# Patient Record
Sex: Male | Born: 1985 | Hispanic: Yes | Marital: Married | Smoking: Former smoker
Health system: Southern US, Community
[De-identification: ages and names within clinical notes are randomized; demographics above are authoritative.]

## PROBLEM LIST (undated history)

## (undated) DIAGNOSIS — Z860100 Personal history of colon polyps, unspecified: Secondary | ICD-10-CM

## (undated) DIAGNOSIS — E663 Overweight: Secondary | ICD-10-CM

## (undated) HISTORY — DX: Personal history of colon polyps, unspecified: Z86.0100

## (undated) HISTORY — DX: Personal history of colonic polyps: Z86.010

## (undated) HISTORY — DX: Overweight: E66.3

## (undated) HISTORY — PX: NO PAST SURGERIES: SHX2092

---

## 2009-08-24 LAB — HM COLONOSCOPY

## 2018-02-22 ENCOUNTER — Encounter: Payer: Self-pay | Admitting: Family Medicine

## 2018-02-22 ENCOUNTER — Ambulatory Visit (INDEPENDENT_AMBULATORY_CARE_PROVIDER_SITE_OTHER): Payer: BLUE CROSS/BLUE SHIELD | Admitting: Family Medicine

## 2018-02-22 VITALS — BP 100/62 | HR 89 | Temp 98.8°F | Resp 16 | Ht 70.0 in | Wt 157.0 lb

## 2018-02-22 DIAGNOSIS — Z0000 Encounter for general adult medical examination without abnormal findings: Secondary | ICD-10-CM

## 2018-02-22 NOTE — Patient Instructions (Signed)
Preventive Care 18-39 Years, Male Preventive care refers to lifestyle choices and visits with your health care provider that can promote health and wellness. What does preventive care include?  A yearly physical exam. This is also called an annual well check.  Dental exams once or twice a year.  Routine eye exams. Ask your health care provider how often you should have your eyes checked.  Personal lifestyle choices, including: ? Daily care of your teeth and gums. ? Regular physical activity. ? Eating a healthy diet. ? Avoiding tobacco and drug use. ? Limiting alcohol use. ? Practicing safe sex. What happens during an annual well check? The services and screenings done by your health care provider during your annual well check will depend on your age, overall health, lifestyle risk factors, and family history of disease. Counseling Your health care provider may ask you questions about your:  Alcohol use.  Tobacco use.  Drug use.  Emotional well-being.  Home and relationship well-being.  Sexual activity.  Eating habits.  Work and work Statistician.  Screening You may have the following tests or measurements:  Height, weight, and BMI.  Blood pressure.  Lipid and cholesterol levels. These may be checked every 5 years starting at age 34.  Diabetes screening. This is done by checking your blood sugar (glucose) after you have not eaten for a while (fasting).  Skin check.  Hepatitis C blood test.  Hepatitis B blood test.  Sexually transmitted disease (STD) testing.  Discuss your test results, treatment options, and if necessary, the need for more tests with your health care provider. Vaccines Your health care provider may recommend certain vaccines, such as:  Influenza vaccine. This is recommended every year.  Tetanus, diphtheria, and acellular pertussis (Tdap, Td) vaccine. You may need a Td booster every 10 years.  Varicella vaccine. You may need this if you  have not been vaccinated.  HPV vaccine. If you are 23 or younger, you may need three doses over 6 months.  Measles, mumps, and rubella (MMR) vaccine. You may need at least one dose of MMR.You may also need a second dose.  Pneumococcal 13-valent conjugate (PCV13) vaccine. You may need this if you have certain conditions and have not been vaccinated.  Pneumococcal polysaccharide (PPSV23) vaccine. You may need one or two doses if you smoke cigarettes or if you have certain conditions.  Meningococcal vaccine. One dose is recommended if you are age 65-21 years and a first-year college student living in a residence hall, or if you have one of several medical conditions. You may also need additional booster doses.  Hepatitis A vaccine. You may need this if you have certain conditions or if you travel or work in places where you may be exposed to hepatitis A.  Hepatitis B vaccine. You may need this if you have certain conditions or if you travel or work in places where you may be exposed to hepatitis B.  Haemophilus influenzae type b (Hib) vaccine. You may need this if you have certain risk factors.  Talk to your health care provider about which screenings and vaccines you need and how often you need them. This information is not intended to replace advice given to you by your health care provider. Make sure you discuss any questions you have with your health care provider. Document Released: 02/06/2002 Document Revised: 08/30/2016 Document Reviewed: 10/12/2015 Elsevier Interactive Patient Education  [employer].

## 2018-02-22 NOTE — Progress Notes (Signed)
Patient: Sean Butler, Male    DOB: 1985-12-26, 32 y.o.   MRN: [id number] Visit Date: 02/22/2018  Today's Provider: Shirlee Latch, MD   I, Joslyn Hy, CMA, am acting as scribe for Shirlee Latch, MD.  Chief Complaint  Patient presents with  . Establish Care   Subjective:    Establish Care Sean Butler is a 32 y.o. male who presents today for health maintenance and to establish care. He feels fairly well. Is c/o cough and some sore throat. He reports exercising 4-5 days per week for 45 minutes; pull-ups, pushups, squats, etc.. He reports he is sleeping well.  -----------------------------------------------------------------   Review of Systems  Constitutional: Negative.   HENT: Positive for sore throat. Negative for congestion, dental problem, drooling, ear discharge, ear pain, facial swelling, hearing loss, mouth sores, nosebleeds, postnasal drip, rhinorrhea, sinus pressure, sinus pain, sneezing, tinnitus, trouble swallowing and voice change.   Eyes: Negative.   Respiratory: Positive for cough. Negative for apnea, choking, chest tightness, shortness of breath, wheezing and stridor.   Cardiovascular: Negative.   Gastrointestinal: Negative.   Endocrine: Negative.   Genitourinary: Negative.   Musculoskeletal: Negative.   Skin: Negative.   Allergic/Immunologic: Negative.   Neurological: Negative.   Hematological: Negative.   Psychiatric/Behavioral: Negative.     Social History      He  reports that  has never smoked. he has never used smokeless tobacco. He reports that he drinks alcohol. He reports that he does not use drugs.       Social History   Socioeconomic History  . Marital status: Married    Spouse name: Sean Butler  . Number of children: 2  . Years of education: None  . Highest education level: Some college, no degree  Social Needs  . Financial resource strain: Not hard at all  . Food insecurity - worry: Never true  . Food insecurity -  inability: Never true  . Transportation needs - medical: No  . Transportation needs - non-medical: No  Occupational History  . Occupation: Realtor  Tobacco Use  . Smoking status: Never Smoker  . Smokeless tobacco: Never Used  Substance and Sexual Activity  . Alcohol use: Yes    Comment: 1 beer every 6 months  . Drug use: No  . Sexual activity: Yes    Partners: Female    Birth control/protection: None  Other Topics Concern  . None  Social History Narrative  . None    History reviewed. No pertinent past medical history.   There are no active problems to display for this patient.   Past Surgical History:  Procedure Laterality Date  . NO PAST SURGERIES      Family History        Family Status  Relation Name Status  . Mother  Alive  . Father  Deceased  . Sister  Alive  . Brother  Alive  . MGM  (Not Specified)  . MGF  Alive  . Sister  Alive  . Neg Hx  (Not Specified)        His family history includes Cancer in his father; Diabetes in his maternal grandfather; Healthy in his brother, mother, sister, and sister; Liver cancer in his maternal grandmother. There is no history of Colon cancer or Prostate cancer.      No Known Allergies  No current outpatient medications on file.   Patient Care Team: Erasmo Downer, MD as PCP - General (Family Medicine)  Objective:   Vitals: BP 100/62 (BP Location: Left Arm, Patient Position: Sitting, Cuff Size: Normal)   Pulse 89   Temp 98.8 F (37.1 C) (Oral)   Resp 16   Ht 5\' 10"  (1.778 m)   Wt 157 lb (71.2 kg)   SpO2 99%   BMI 22.53 kg/m    Vitals:   02/22/18 1423  BP: 100/62  Pulse: 89  Resp: 16  Temp: 98.8 F (37.1 C)  TempSrc: Oral  SpO2: 99%  Weight: 157 lb (71.2 kg)  Height: 5\' 10"  (1.778 m)     Physical Exam  Constitutional: He is oriented to person, place, and time. He appears well-developed and well-nourished. No distress.  HENT:  Head: Normocephalic and atraumatic.  Right Ear: External  ear normal.  Left Ear: External ear normal.  Nose: Nose normal.  Mouth/Throat: Oropharynx is clear and moist.  Eyes: Conjunctivae and EOM are normal. Pupils are equal, round, and reactive to light. No scleral icterus.  Neck: Neck supple. No thyromegaly present.  Cardiovascular: Normal rate, regular rhythm, normal heart sounds and intact distal pulses.  No murmur heard. Pulmonary/Chest: Effort normal and breath sounds normal. No respiratory distress. He has no wheezes. He has no rales.  Abdominal: Soft. Bowel sounds are normal. He exhibits no distension. There is no tenderness. There is no rebound and no guarding.  Musculoskeletal: He exhibits no edema or deformity.  Lymphadenopathy:    He has no cervical adenopathy.  Neurological: He is alert and oriented to person, place, and time.  Skin: Skin is warm and dry. No rash noted.  Psychiatric: He has a normal mood and affect. His behavior is normal.  Vitals reviewed.    Depression Screen PHQ 2/9 Scores 02/22/2018  PHQ - 2 Score 1     Assessment & Plan:     Routine Health Maintenance and Physical Exam  Exercise Activities and Dietary recommendations Goals    None       There is no immunization history on file for this patient.  Health Maintenance  Topic Date Due  . HIV Screening  01/17/2002  . TETANUS/TDAP  01/17/2006  . INFLUENZA VACCINE  03/24/2018 (Originally 07/25/2017)   Will request TDAP records from [facility] (patient reports having TDAP 3 years ago when daughter was born)  Discussed health benefits of physical activity, and encouraged him to engage in regular exercise appropriate for his age and condition.    -------------------------------------------------------------------- Orders Placed This Encounter  Procedures  . Basic Metabolic Panel (BMET)    Order Specific Question:   Has the patient fasted?    Answer:   No  . Lipid panel    Order Specific Question:   Has the patient fasted?    Answer:   No    Return in  about 1 year (around 02/23/2019) for physical.   The entirety of the information documented in the History of Present Illness, Review of Systems and Physical Exam were personally obtained by me. Portions of this information were initially documented by Irving BurtonEmily Ratchford, CMA and reviewed by me for thoroughness and accuracy.    Erasmo DownerBacigalupo, Zephyr Sausedo M, MD, MPH [facility] 02/22/2018 3:38 PM

## 2018-02-25 DIAGNOSIS — Z0000 Encounter for general adult medical examination without abnormal findings: Secondary | ICD-10-CM | POA: Diagnosis not present

## 2018-02-26 LAB — LIPID PANEL
CHOLESTEROL TOTAL: 135 mg/dL (ref 100–199)
Chol/HDL Ratio: 2.1 ratio (ref 0.0–5.0)
HDL: 65 mg/dL (ref >39)
LDL CALC: 62 mg/dL (ref 0–99)
TRIGLYCERIDES: 39 mg/dL (ref 0–149)
VLDL Cholesterol Cal: 8 mg/dL (ref 5–40)

## 2018-02-26 LAB — BASIC METABOLIC PANEL
BUN / CREAT RATIO: 19 (ref 9–20)
BUN: 14 mg/dL (ref 6–20)
CHLORIDE: 102 mmol/L (ref 96–106)
CO2: 24 mmol/L (ref 20–29)
CREATININE: 0.74 mg/dL — AB (ref 0.76–1.27)
Calcium: 9.1 mg/dL (ref 8.7–10.2)
GFR calc Af Amer: 142 mL/min/{1.73_m2} (ref >59)
GFR calc non Af Amer: 123 mL/min/{1.73_m2} (ref >59)
GLUCOSE: 79 mg/dL (ref 65–99)
POTASSIUM: 3.9 mmol/L (ref 3.5–5.2)
SODIUM: 142 mmol/L (ref 134–144)

## 2018-03-01 ENCOUNTER — Telehealth: Payer: Self-pay

## 2018-03-01 NOTE — Telephone Encounter (Signed)
-----   Message from Erasmo DownerAngela M Bacigalupo, MD sent at 02/28/2018  8:43 AM EST ----- Normal kidney function, blood sugar, electrolytes.  Great cholesterol  Bacigalupo, Marzella SchleinAngela M, MD, MPH [facility] 02/28/2018 8:43 AM

## 2018-03-01 NOTE — Telephone Encounter (Signed)
Pt advised.

## 2018-09-25 ENCOUNTER — Telehealth: Payer: Self-pay | Admitting: Family Medicine

## 2018-09-25 NOTE — Telephone Encounter (Signed)
Blood type is not in chart. Patient advised

## 2018-09-25 NOTE — Telephone Encounter (Signed)
Pt is wanting to know if his blood type is in has chart anywhere.  He is completing a form that ask this information.  He knows Dr. Leonard Schwartz is out until next week but wants to know if someone can check for him  CB#  [phone]  Thanks Barth Kirks

## 2019-02-25 ENCOUNTER — Encounter: Payer: Self-pay | Admitting: Family Medicine

## 2019-02-25 ENCOUNTER — Ambulatory Visit (INDEPENDENT_AMBULATORY_CARE_PROVIDER_SITE_OTHER): Payer: BLUE CROSS/BLUE SHIELD | Admitting: Family Medicine

## 2019-02-25 VITALS — BP 109/61 | HR 76 | Temp 97.4°F | Ht 70.0 in | Wt 182.8 lb

## 2019-02-25 DIAGNOSIS — E663 Overweight: Secondary | ICD-10-CM | POA: Diagnosis not present

## 2019-02-25 DIAGNOSIS — Z0000 Encounter for general adult medical examination without abnormal findings: Secondary | ICD-10-CM

## 2019-02-25 DIAGNOSIS — Z114 Encounter for screening for human immunodeficiency virus [HIV]: Secondary | ICD-10-CM | POA: Diagnosis not present

## 2019-02-25 NOTE — Progress Notes (Signed)
Patient: Sean Butler, Male    DOB: 03-18-1986, 33 y.o.   MRN: [id number] Visit Date: 02/25/2019  Today's Provider: Shirlee Latch, MD   Chief Complaint  Patient presents with  . Annual Exam   Subjective:    I, Presley Raddle, CMA, am acting as a scribe for Shirlee Latch, MD.    Annual physical exam Sean Butler is a 33 y.o. male who presents today for health maintenance and complete physical. He feels well. He reports exercising 3 days per week. He reports he is sleeping fairly well.  -----------------------------------------------------------------   Review of Systems  Constitutional: Negative.   HENT: Negative.   Eyes: Positive for photophobia.  Respiratory: Negative.   Cardiovascular: Negative.   Gastrointestinal: Negative.   Endocrine: Negative.   Genitourinary: Negative.   Musculoskeletal: Negative.   Skin: Negative.   Allergic/Immunologic: Negative.   Neurological: Negative.   Hematological: Negative.   Psychiatric/Behavioral: Negative.     Social History      He  reports that he has never smoked. He has never used smokeless tobacco. He reports current alcohol use. He reports that he does not use drugs.       Social History   Socioeconomic History  . Marital status: Married    Spouse name: Pollie Meyer  . Number of children: 2  . Years of education: Not on file  . Highest education level: Some college, no degree  Occupational History  . Occupation: Veterinary surgeon  Social Needs  . Financial resource strain: Not hard at all  . Food insecurity:    Worry: Never true    Inability: Never true  . Transportation needs:    Medical: No    Non-medical: No  Tobacco Use  . Smoking status: Never Smoker  . Smokeless tobacco: Never Used  Substance and Sexual Activity  . Alcohol use: Yes    Comment: 1 beer every 6 months  . Drug use: No  . Sexual activity: Yes    Partners: Female    Birth control/protection: None  Lifestyle  . Physical activity:    Days  per week: 4 days    Minutes per session: 50 min  . Stress: Not on file  Relationships  . Social connections:    Talks on phone: Not on file    Gets together: Not on file    Attends religious service: Not on file    Active member of club or organization: Not on file    Attends meetings of clubs or organizations: Not on file    Relationship status: Not on file  Other Topics Concern  . Not on file  Social History Narrative  . Not on file    History reviewed. No pertinent past medical history.   There are no active problems to display for this patient.   Past Surgical History:  Procedure Laterality Date  . NO PAST SURGERIES      Family History        Family Status  Relation Name Status  . Mother  Alive  . Father  Deceased  . Sister  Alive  . Brother  Alive  . MGM  (Not Specified)  . MGF  Alive  . Sister  Alive  . Neg Hx  (Not Specified)        His family history includes Cancer in his father; Diabetes in his maternal grandfather; Healthy in his brother, mother, sister, and sister; Liver cancer in his maternal grandmother. There is no history of Colon  cancer or Prostate cancer.      No Known Allergies  No current outpatient medications on file.   Patient Care Team: Erasmo Downer, MD as PCP - General (Family Medicine)    Objective:    Vitals: BP 109/61 (BP Location: Right Arm, Patient Position: Sitting, Cuff Size: Normal)   Pulse 76   Temp (!) 97.4 F (36.3 C) (Oral)   Ht 5\' 10"  (1.778 m)   Wt 182 lb 12.8 oz (82.9 kg)   SpO2 99%   BMI 26.23 kg/m    Vitals:   02/25/19 0949  BP: 109/61  Pulse: 76  Temp: (!) 97.4 F (36.3 C)  TempSrc: Oral  SpO2: 99%  Weight: 182 lb 12.8 oz (82.9 kg)  Height: 5\' 10"  (1.778 m)     Physical Exam Vitals signs reviewed.  Constitutional:      General: He is not in acute distress.    Appearance: Normal appearance. He is well-developed. He is not diaphoretic.  HENT:     Head: Normocephalic and atraumatic.      Right Ear: Tympanic membrane, ear canal and external ear normal.     Left Ear: Tympanic membrane, ear canal and external ear normal.     Nose: Nose normal. No congestion.     Mouth/Throat:     Mouth: Mucous membranes are moist.     Pharynx: Oropharynx is clear. No oropharyngeal exudate.  Eyes:     General: No scleral icterus.    Conjunctiva/sclera: Conjunctivae normal.     Pupils: Pupils are equal, round, and reactive to light.  Neck:     Musculoskeletal: Neck supple.     Thyroid: No thyromegaly.  Cardiovascular:     Rate and Rhythm: Normal rate and regular rhythm.     Pulses: Normal pulses.     Heart sounds: Normal heart sounds. No murmur.  Pulmonary:     Effort: Pulmonary effort is normal. No respiratory distress.     Breath sounds: Normal breath sounds. No wheezing or rales.  Abdominal:     General: Bowel sounds are normal. There is no distension.     Palpations: Abdomen is soft.     Tenderness: There is no abdominal tenderness. There is no guarding or rebound.  Musculoskeletal:        General: No deformity.     Right lower leg: No edema.     Left lower leg: No edema.  Lymphadenopathy:     Cervical: No cervical adenopathy.  Skin:    General: Skin is warm and dry.     Capillary Refill: Capillary refill takes less than 2 seconds.     Findings: No rash.  Neurological:     Mental Status: He is alert and oriented to person, place, and time. Mental status is at baseline.  Psychiatric:        Mood and Affect: Mood normal.        Behavior: Behavior normal.        Thought Content: Thought content normal.      Depression Screen PHQ 2/9 Scores 02/22/2018  PHQ - 2 Score 1       Assessment & Plan:     Routine Health Maintenance and Physical Exam  Exercise Activities and Dietary recommendations Goals   None      There is no immunization history on file for this patient.  Health Maintenance  Topic Date Due  . HIV Screening  01/17/2002  . TETANUS/TDAP  01/17/2006    . INFLUENZA VACCINE  03/25/2019 (Originally 07/25/2018)     Discussed health benefits of physical activity, and encouraged him to engage in regular exercise appropriate for his age and condition.    --------------------------------------------------------------------  Problem List Items Addressed This Visit    None    Visit Diagnoses    Encounter for annual physical exam    -  Primary   Relevant Orders   Lipid panel   Comprehensive metabolic panel   CBC w/Diff/Platelet   HIV antibody (with reflex)   Overweight       Relevant Orders   Lipid panel   Comprehensive metabolic panel   Screening for HIV (human immunodeficiency virus)       Relevant Orders   HIV antibody (with reflex)       Return in about 1 year (around 02/25/2020) for CPE.   The entirety of the information documented in the History of Present Illness, Review of Systems and Physical Exam were personally obtained by me. Portions of this information were initially documented by Presley Raddle, CMA and reviewed by me for thoroughness and accuracy.    Erasmo Downer, MD, MPH [facility] 02/25/2019 10:34 AM

## 2019-02-25 NOTE — Patient Instructions (Signed)
Preventive Care 18-39 Years, Male Preventive care refers to lifestyle choices and visits with your health care provider that can promote health and wellness. What does preventive care include?   A yearly physical exam. This is also called an annual well check.  Dental exams once or twice a year.  Routine eye exams. Ask your health care provider how often you should have your eyes checked.  Personal lifestyle choices, including: ? Daily care of your teeth and gums. ? Regular physical activity. ? Eating a healthy diet. ? Avoiding tobacco and drug use. ? Limiting alcohol use. ? Practicing safe sex. What happens during an annual well check? The services and screenings done by your health care provider during your annual well check will depend on your age, overall health, lifestyle risk factors, and family history of disease. Counseling Your health care provider may ask you questions about your:  Alcohol use.  Tobacco use.  Drug use.  Emotional well-being.  Home and relationship well-being.  Sexual activity.  Eating habits.  Work and work environment. Screening You may have the following tests or measurements:  Height, weight, and BMI.  Blood pressure.  Lipid and cholesterol levels. These may be checked every 5 years starting at age 20.  Diabetes screening. This is done by checking your blood sugar (glucose) after you have not eaten for a while (fasting).  Skin check.  Hepatitis C blood test.  Hepatitis B blood test.  Sexually transmitted disease (STD) testing. Discuss your test results, treatment options, and if necessary, the need for more tests with your health care provider. Vaccines Your health care provider may recommend certain vaccines, such as:  Influenza vaccine. This is recommended every year.  Tetanus, diphtheria, and acellular pertussis (Tdap, Td) vaccine. You may need a Td booster every 10 years.  Varicella vaccine. You may need this if you  have not been vaccinated.  HPV vaccine. If you are 26 or younger, you may need three doses over 6 months.  Measles, mumps, and rubella (MMR) vaccine. You may need at least one dose of MMR.You may also need a second dose.  Pneumococcal 13-valent conjugate (PCV13) vaccine. You may need this if you have certain conditions and have not been vaccinated.  Pneumococcal polysaccharide (PPSV23) vaccine. You may need one or two doses if you smoke cigarettes or if you have certain conditions.  Meningococcal vaccine. One dose is recommended if you are age 19-21 years and a first-year college student living in a residence hall, or if you have one of several medical conditions. You may also need additional booster doses.  Hepatitis A vaccine. You may need this if you have certain conditions or if you travel or work in places where you may be exposed to hepatitis A.  Hepatitis B vaccine. You may need this if you have certain conditions or if you travel or work in places where you may be exposed to hepatitis B.  Haemophilus influenzae type b (Hib) vaccine. You may need this if you have certain risk factors. Talk to your health care provider about which screenings and vaccines you need and how often you need them. This information is not intended to replace advice given to you by your health care provider. Make sure you discuss any questions you have with your health care provider. Document Released: 02/06/2002 Document Revised: 07/24/2017 Document Reviewed: 10/12/2015 Elsevier Interactive Patient Education  2019 [employer]  

## 2019-02-26 ENCOUNTER — Telehealth: Payer: Self-pay

## 2019-02-26 DIAGNOSIS — D729 Disorder of white blood cells, unspecified: Secondary | ICD-10-CM

## 2019-02-26 LAB — COMPREHENSIVE METABOLIC PANEL
A/G RATIO: 1.9 (ref 1.2–2.2)
ALK PHOS: 59 IU/L (ref 39–117)
ALT: 13 IU/L (ref 0–44)
AST: 17 IU/L (ref 0–40)
Albumin: 4.5 g/dL (ref 4.0–5.0)
BUN/Creatinine Ratio: 21 — ABNORMAL HIGH (ref 9–20)
BUN: 18 mg/dL (ref 6–20)
Bilirubin Total: 0.3 mg/dL (ref 0.0–1.2)
CALCIUM: 9.2 mg/dL (ref 8.7–10.2)
CHLORIDE: 103 mmol/L (ref 96–106)
CO2: 22 mmol/L (ref 20–29)
Creatinine, Ser: 0.86 mg/dL (ref 0.76–1.27)
GFR calc non Af Amer: 115 mL/min/{1.73_m2} (ref >59)
GFR, EST AFRICAN AMERICAN: 133 mL/min/{1.73_m2} (ref >59)
Globulin, Total: 2.4 g/dL (ref 1.5–4.5)
Glucose: 79 mg/dL (ref 65–99)
POTASSIUM: 4.1 mmol/L (ref 3.5–5.2)
Sodium: 140 mmol/L (ref 134–144)
Total Protein: 6.9 g/dL (ref 6.0–8.5)

## 2019-02-26 LAB — CBC WITH DIFFERENTIAL/PLATELET
BASOS ABS: 0 10*3/uL (ref 0.0–0.2)
Basos: 1 %
EOS (ABSOLUTE): 0.1 10*3/uL (ref 0.0–0.4)
Eos: 6 %
Hematocrit: 39.7 % (ref 37.5–51.0)
Hemoglobin: 13.6 g/dL (ref 13.0–17.7)
IMMATURE GRANS (ABS): 0 10*3/uL (ref 0.0–0.1)
Immature Granulocytes: 0 %
LYMPHS: 49 %
Lymphocytes Absolute: 1.2 10*3/uL (ref 0.7–3.1)
MCH: 29.8 pg (ref 26.6–33.0)
MCHC: 34.3 g/dL (ref 31.5–35.7)
MCV: 87 fL (ref 79–97)
Monocytes Absolute: 0.3 10*3/uL (ref 0.1–0.9)
Monocytes: 10 %
NEUTROS ABS: 0.9 10*3/uL — AB (ref 1.4–7.0)
NEUTROS PCT: 34 %
Platelets: 216 10*3/uL (ref 150–450)
RBC: 4.57 x10E6/uL (ref 4.14–5.80)
RDW: 12.2 % (ref 11.6–15.4)
WBC: 2.5 10*3/uL — CL (ref 3.4–10.8)

## 2019-02-26 LAB — LIPID PANEL
CHOL/HDL RATIO: 2.6 ratio (ref 0.0–5.0)
Cholesterol, Total: 163 mg/dL (ref 100–199)
HDL: 62 mg/dL (ref >39)
LDL CALC: 94 mg/dL (ref 0–99)
TRIGLYCERIDES: 33 mg/dL (ref 0–149)
VLDL Cholesterol Cal: 7 mg/dL (ref 5–40)

## 2019-02-26 LAB — HIV ANTIBODY (ROUTINE TESTING W REFLEX): HIV Screen 4th Generation wRfx: NONREACTIVE

## 2019-02-26 NOTE — Telephone Encounter (Signed)
Patient advised. Patient states he had a mild upper respiratory symptoms approximately 2 weeks ago.

## 2019-02-26 NOTE — Telephone Encounter (Signed)
-----   Message from Erasmo Downer, MD sent at 02/26/2019  2:40 PM EST ----- Normal labs, other than low WBC.  Any recent illness?  See if we can add on a peripheral blood smear for pathologist review.  Would like to recheck in 1-2 weeks

## 2019-02-26 NOTE — Telephone Encounter (Signed)
Not able to add peripheral blood smear. Patient advised per Dr. Beryle Flock to come back next week for repeat CBC and blood smear. Lab slip printed at front desk for pick up.

## 2019-03-05 DIAGNOSIS — D729 Disorder of white blood cells, unspecified: Secondary | ICD-10-CM | POA: Diagnosis not present

## 2019-03-06 LAB — CBC WITH DIFFERENTIAL/PLATELET
Basophils Absolute: 0 10*3/uL (ref 0.0–0.2)
Basos: 1 %
EOS (ABSOLUTE): 0.1 10*3/uL (ref 0.0–0.4)
EOS: 4 %
HEMATOCRIT: 37.3 % — AB (ref 37.5–51.0)
HEMOGLOBIN: 12.8 g/dL — AB (ref 13.0–17.7)
Immature Grans (Abs): 0 10*3/uL (ref 0.0–0.1)
Immature Granulocytes: 0 %
LYMPHS ABS: 1.5 10*3/uL (ref 0.7–3.1)
Lymphs: 42 %
MCH: 30.3 pg (ref 26.6–33.0)
MCHC: 34.3 g/dL (ref 31.5–35.7)
MCV: 88 fL (ref 79–97)
MONOCYTES: 7 %
Monocytes Absolute: 0.3 10*3/uL (ref 0.1–0.9)
NEUTROS ABS: 1.6 10*3/uL (ref 1.4–7.0)
Neutrophils: 46 %
Platelets: 207 10*3/uL (ref 150–450)
RBC: 4.23 x10E6/uL (ref 4.14–5.80)
RDW: 12.1 % (ref 11.6–15.4)
WBC: 3.6 10*3/uL (ref 3.4–10.8)

## 2019-03-07 ENCOUNTER — Telehealth: Payer: Self-pay

## 2019-03-07 LAB — PATHOLOGIST SMEAR REVIEW
Basophils Absolute: 0 10*3/uL (ref 0.0–0.2)
Basos: 1 %
EOS (ABSOLUTE): 0.2 10*3/uL (ref 0.0–0.4)
Eos: 5 %
Hematocrit: 39.3 % (ref 37.5–51.0)
Hemoglobin: 13 g/dL (ref 13.0–17.7)
Immature Grans (Abs): 0 10*3/uL (ref 0.0–0.1)
Immature Granulocytes: 0 %
LYMPHS: 41 %
Lymphocytes Absolute: 1.5 10*3/uL (ref 0.7–3.1)
MCH: 29.8 pg (ref 26.6–33.0)
MCHC: 33.1 g/dL (ref 31.5–35.7)
MCV: 90 fL (ref 79–97)
MONOCYTES: 9 %
Monocytes Absolute: 0.3 10*3/uL (ref 0.1–0.9)
NEUTROS ABS: 1.6 10*3/uL (ref 1.4–7.0)
NEUTROS PCT: 44 %
PATH REV PLTS: NORMAL
PATH REV RBC: NORMAL
Path Rev WBC: NORMAL
Platelets: 221 10*3/uL (ref 150–450)
RBC: 4.36 x10E6/uL (ref 4.14–5.80)
RDW: 12.2 % (ref 11.6–15.4)
WBC: 3.6 10*3/uL (ref 3.4–10.8)

## 2019-03-07 NOTE — Telephone Encounter (Signed)
-----   Message from Erasmo Downer, MD sent at 03/07/2019 11:47 AM EDT ----- WBC now normal.  Pathologist review normal.  Will check again at next visit

## 2019-03-07 NOTE — Telephone Encounter (Signed)
Left patient a message advising him that labs are normal and if he has any questions to call back.

## 2020-03-09 ENCOUNTER — Other Ambulatory Visit: Payer: Self-pay

## 2020-03-09 ENCOUNTER — Encounter: Payer: Self-pay | Admitting: Family Medicine

## 2020-03-09 ENCOUNTER — Ambulatory Visit (INDEPENDENT_AMBULATORY_CARE_PROVIDER_SITE_OTHER): Payer: BC Managed Care – PPO | Admitting: Family Medicine

## 2020-03-09 ENCOUNTER — Telehealth: Payer: Self-pay

## 2020-03-09 VITALS — BP 105/63 | HR 61 | Temp 97.1°F | Resp 16 | Ht 70.0 in | Wt 182.0 lb

## 2020-03-09 DIAGNOSIS — Z0000 Encounter for general adult medical examination without abnormal findings: Secondary | ICD-10-CM

## 2020-03-09 DIAGNOSIS — Z1211 Encounter for screening for malignant neoplasm of colon: Secondary | ICD-10-CM

## 2020-03-09 DIAGNOSIS — Z860100 Personal history of colon polyps, unspecified: Secondary | ICD-10-CM

## 2020-03-09 DIAGNOSIS — E663 Overweight: Secondary | ICD-10-CM | POA: Diagnosis not present

## 2020-03-09 DIAGNOSIS — D72819 Decreased white blood cell count, unspecified: Secondary | ICD-10-CM | POA: Diagnosis not present

## 2020-03-09 NOTE — Patient Instructions (Signed)
Preventive Care 19-33 Years Old, Male Preventive care refers to lifestyle choices and visits with your health care provider that can promote health and wellness. This includes:  A yearly physical exam. This is also called an annual well check.  Regular dental and eye exams.  Immunizations.  Screening for certain conditions.  Healthy lifestyle choices, such as eating a healthy diet, getting regular exercise, not using drugs or products that contain nicotine and tobacco, and limiting alcohol use. What can I expect for my preventive care visit? Physical exam Your health care provider will check:  Height and weight. These may be used to calculate body mass index (BMI), which is a measurement that tells if you are at a healthy weight.  Heart rate and blood pressure.  Your skin for abnormal spots. Counseling Your health care provider may ask you questions about:  Alcohol, tobacco, and drug use.  Emotional well-being.  Home and relationship well-being.  Sexual activity.  Eating habits.  Work and work Statistician. What immunizations do I need?  Influenza (flu) vaccine  This is recommended every year. Tetanus, diphtheria, and pertussis (Tdap) vaccine  You may need a Td booster every 10 years. Varicella (chickenpox) vaccine  You may need this vaccine if you have not already been vaccinated. Human papillomavirus (HPV) vaccine  If recommended by your health care provider, you may need three doses over 6 months. Measles, mumps, and rubella (MMR) vaccine  You may need at least one dose of MMR. You may also need a second dose. Meningococcal conjugate (MenACWY) vaccine  One dose is recommended if you are 45-76 years old and a Market researcher living in a residence hall, or if you have one of several medical conditions. You may also need additional booster doses. Pneumococcal conjugate (PCV13) vaccine  You may need this if you have certain conditions and were not  previously vaccinated. Pneumococcal polysaccharide (PPSV23) vaccine  You may need one or two doses if you smoke cigarettes or if you have certain conditions. Hepatitis A vaccine  You may need this if you have certain conditions or if you travel or work in places where you may be exposed to hepatitis A. Hepatitis B vaccine  You may need this if you have certain conditions or if you travel or work in places where you may be exposed to hepatitis B. Haemophilus influenzae type b (Hib) vaccine  You may need this if you have certain risk factors. You may receive vaccines as individual doses or as more than one vaccine together in one shot (combination vaccines). Talk with your health care provider about the risks and benefits of combination vaccines. What tests do I need? Blood tests  Lipid and cholesterol levels. These may be checked every 5 years starting at age 17.  Hepatitis C test.  Hepatitis B test. Screening   Diabetes screening. This is done by checking your blood sugar (glucose) after you have not eaten for a while (fasting).  Sexually transmitted disease (STD) testing. Talk with your health care provider about your test results, treatment options, and if necessary, the need for more tests. Follow these instructions at home: Eating and drinking   Eat a diet that includes fresh fruits and vegetables, whole grains, lean protein, and low-fat dairy products.  Take vitamin and mineral supplements as recommended by your health care provider.  Do not drink alcohol if your health care provider tells you not to drink.  If you drink alcohol: ? Limit how much you have to 0-2  drinks a day. ? Be aware of how much alcohol is in your drink. In the [place] one drink equals one 12 oz bottle of beer (355 mL), one 5 oz glass of wine (148 mL), or one 1 oz glass of hard liquor (44 mL). Lifestyle  Take daily care of your teeth and gums.  Stay active. Exercise for at least 30 minutes on 5 or  more days each week.  Do not use any products that contain nicotine or tobacco, such as cigarettes, e-cigarettes, and chewing tobacco. If you need help quitting, ask your health care provider.  If you are sexually active, practice safe sex. Use a condom or other form of protection to prevent STIs (sexually transmitted infections). What's next?  Go to your health care provider once a year for a well check visit.  Ask your health care provider how often you should have your eyes and teeth checked.  Stay up to date on all vaccines. This information is not intended to replace advice given to you by your health care provider. Make sure you discuss any questions you have with your health care provider. Document Revised: 12/05/2018 Document Reviewed: 12/05/2018 Elsevier Patient Education  2020 [employer].

## 2020-03-09 NOTE — Telephone Encounter (Signed)
Gastroenterology Pre-Procedure Review  Request Date: Wed 04/07/20 Requesting Physician: Dr. Tobi Bastos  PATIENT REVIEW QUESTIONS: The patient responded to the following health history questions as indicated:    1. Are you having any GI issues? no 2. Do you have a personal history of Polyps? yes (patient states its been over 10 years) 3. Do you have a family history of Colon Cancer or Polyps? no 4. Diabetes Mellitus? no 5. Joint replacements in the past 12 months?no 6. Major health problems in the past 3 months?no 7. Any artificial heart valves, MVP, or defibrillator?no    MEDICATIONS & ALLERGIES:    Patient reports the following regarding taking any anticoagulation/antiplatelet therapy:   Plavix, Coumadin, Eliquis, Xarelto, Lovenox, Pradaxa, Brilinta, or Effient? no Aspirin? no  Patient confirms/reports the following medications:  No current outpatient medications on file.   No current facility-administered medications for this visit.    Patient confirms/reports the following allergies:  No Known Allergies  No orders of the defined types were placed in this encounter.   AUTHORIZATION INFORMATION Primary Insurance: 1D#: Group #:  Secondary Insurance: 1D#: Group #:  SCHEDULE INFORMATION: Date: Wed April 14th Time: Location:[facility]

## 2020-03-09 NOTE — Assessment & Plan Note (Signed)
Discussed importance of healthy weight management Discussed diet and exercise  

## 2020-03-09 NOTE — Assessment & Plan Note (Signed)
Per patient report had colonoscopy 10 yrs ago with colon polyps and was recommend to repeat colonoscopy routinely Will send [facility] for records Will refer to GI for possible repeat colonoscopy - suspect they will want records as well

## 2020-03-09 NOTE — Progress Notes (Signed)
Patient: Sean Butler, Male    DOB: Jun 18, 1986, 34 y.o.   MRN: [id number] Visit Date: 03/09/2020  Today's Provider: Lavon Paganini, MD   Chief Complaint  Patient presents with  . Annual Exam   Subjective:     Annual physical exam Sean Butler is a 34 y.o. male who presents today for health maintenance and complete physical. He feels well. He reports exercising yes. He reports he is sleeping well.  -----------------------------------------------------------------   Review of Systems  Constitutional: Negative.   HENT: Negative.   Eyes: Negative.   Respiratory: Negative.   Cardiovascular: Negative.   Gastrointestinal: Negative.   Endocrine: Negative.   Genitourinary: Negative.   Musculoskeletal: Negative.   Skin: Negative.   Allergic/Immunologic: Negative.   Neurological: Negative.   Hematological: Negative.   Psychiatric/Behavioral: Negative.     Social History      He  reports that he has never smoked. He has never used smokeless tobacco. He reports current alcohol use. He reports that he does not use drugs.       Social History   Socioeconomic History  . Marital status: Married    Spouse name: Gerre Scull  . Number of children: 2  . Years of education: Not on file  . Highest education level: Some college, no degree  Occupational History  . Occupation: Realtor  Tobacco Use  . Smoking status: Never Smoker  . Smokeless tobacco: Never Used  Substance and Sexual Activity  . Alcohol use: Yes    Comment: 1 beer every 6 months  . Drug use: No  . Sexual activity: Yes    Partners: Female    Birth control/protection: None  Other Topics Concern  . Not on file  Social History Narrative  . Not on file   Social Determinants of Health   Financial Resource Strain:   . Difficulty of Paying Living Expenses:   Food Insecurity:   . Worried About Charity fundraiser in the Last Year:   . Arboriculturist in the Last Year:   Transportation Needs:   . Consulting civil engineer (Medical):   Marland Kitchen Lack of Transportation (Non-Medical):   Physical Activity:   . Days of Exercise per Week:   . Minutes of Exercise per Session:   Stress:   . Feeling of Stress :   Social Connections:   . Frequency of Communication with Friends and Family:   . Frequency of Social Gatherings with Friends and Family:   . Attends Religious Services:   . Active Member of Clubs or Organizations:   . Attends Archivist Meetings:   Marland Kitchen Marital Status:     No past medical history on file.   Patient Active Problem List   Diagnosis Date Noted  . History of colon polyps 03/09/2020  . Overweight 02/25/2019    Past Surgical History:  Procedure Laterality Date  . NO PAST SURGERIES      Family History        Family Status  Relation Name Status  . Mother  Alive  . Father  Deceased  . Sister  Alive  . Brother  Alive  . MGM  (Not Specified)  . MGF  Alive  . Sister  Alive  . Neg Hx  (Not Specified)        His family history includes Cancer in his father; Diabetes in his maternal grandfather; Healthy in his brother, mother, sister, and sister; Liver cancer in his maternal grandmother.  There is no history of Colon cancer or Prostate cancer.      No Known Allergies  No current outpatient medications on file.   Patient Care Team: Erasmo Downer, MD as PCP - General (Family Medicine)    Objective:    Vitals: BP 105/63 (BP Location: Left Arm, Patient Position: Sitting, Cuff Size: Large)   Pulse 61   Temp (!) 97.1 F (36.2 C) (Temporal)   Resp 16   Ht 5\' 10"  (1.778 m)   Wt 182 lb (82.6 kg)   BMI 26.11 kg/m    Vitals:   03/09/20 1002  BP: 105/63  Pulse: 61  Resp: 16  Temp: (!) 97.1 F (36.2 C)  TempSrc: Temporal  Weight: 182 lb (82.6 kg)  Height: 5\' 10"  (1.778 m)     Physical Exam Vitals reviewed.  Constitutional:      General: He is not in acute distress.    Appearance: Normal appearance. He is well-developed. He is not diaphoretic.   HENT:     Head: Normocephalic and atraumatic.     Right Ear: Tympanic membrane, ear canal and external ear normal.     Left Ear: Tympanic membrane, ear canal and external ear normal.  Eyes:     General: No scleral icterus.    Conjunctiva/sclera: Conjunctivae normal.     Pupils: Pupils are equal, round, and reactive to light.  Neck:     Thyroid: No thyromegaly.  Cardiovascular:     Rate and Rhythm: Normal rate and regular rhythm.     Heart sounds: Normal heart sounds. No murmur.  Pulmonary:     Effort: Pulmonary effort is normal. No respiratory distress.     Breath sounds: Normal breath sounds. No wheezing or rales.  Abdominal:     General: Bowel sounds are normal. There is no distension.     Palpations: Abdomen is soft.     Tenderness: There is no abdominal tenderness. There is no guarding or rebound.  Musculoskeletal:        General: No deformity.     Cervical back: Neck supple.     Right lower leg: No edema.     Left lower leg: No edema.  Lymphadenopathy:     Cervical: No cervical adenopathy.  Skin:    General: Skin is warm and dry.     Capillary Refill: Capillary refill takes less than 2 seconds.     Findings: No rash.  Neurological:     Mental Status: He is alert and oriented to person, place, and time.  Psychiatric:        Mood and Affect: Mood normal.        Behavior: Behavior normal.        Thought Content: Thought content normal.      Depression Screen PHQ 2/9 Scores 03/09/2020 02/25/2019 02/22/2018  PHQ - 2 Score 0 0 1  PHQ- 9 Score 1 2 -       Assessment & Plan:     Routine Health Maintenance and Physical Exam  Exercise Activities and Dietary recommendations Goals   None      There is no immunization history on file for this patient.  Health Maintenance  Topic Date Due  . TETANUS/TDAP  Never done  . INFLUENZA VACCINE  03/24/2020 (Originally 07/26/2019)  . HIV Screening  Completed     Discussed health benefits of physical activity, and  encouraged him to engage in regular exercise appropriate for his age and condition.    --------------------------------------------------------------------  Problem List Items Addressed This Visit      Other   Overweight    Discussed importance of healthy weight management Discussed diet and exercise       Relevant Orders   CBC   Comprehensive metabolic panel   Lipid panel   History of colon polyps    Per patient report had colonoscopy 10 yrs ago with colon polyps and was recommend to repeat colonoscopy routinely Will send [facility] for records Will refer to GI for possible repeat colonoscopy - suspect they will want records as well      Relevant Orders   Ambulatory referral to Gastroenterology    Other Visit Diagnoses    Encounter for annual physical exam    -  Primary   Relevant Orders   CBC   Comprehensive metabolic panel   Lipid panel       Return in about 1 year (around 03/09/2021) for CPE.   The entirety of the information documented in the History of Present Illness, Review of Systems and Physical Exam were personally obtained by me. Portions of this information were initially documented by Rondel Baton, CMA and reviewed by me for thoroughness and accuracy.    Rion Catala, Marzella Schlein, MD MPH [facility]

## 2020-03-10 ENCOUNTER — Encounter: Payer: Self-pay | Admitting: Family Medicine

## 2020-03-10 LAB — COMPREHENSIVE METABOLIC PANEL
ALT: 12 IU/L (ref 0–44)
AST: 14 IU/L (ref 0–40)
Albumin/Globulin Ratio: 1.6 (ref 1.2–2.2)
Albumin: 4.5 g/dL (ref 4.0–5.0)
Alkaline Phosphatase: 52 IU/L (ref 39–117)
BUN/Creatinine Ratio: 18 (ref 9–20)
BUN: 16 mg/dL (ref 6–20)
Bilirubin Total: 0.5 mg/dL (ref 0.0–1.2)
CO2: 24 mmol/L (ref 20–29)
Calcium: 9.5 mg/dL (ref 8.7–10.2)
Chloride: 103 mmol/L (ref 96–106)
Creatinine, Ser: 0.88 mg/dL (ref 0.76–1.27)
GFR calc Af Amer: 130 mL/min/{1.73_m2} (ref >59)
GFR calc non Af Amer: 113 mL/min/{1.73_m2} (ref >59)
Globulin, Total: 2.8 g/dL (ref 1.5–4.5)
Glucose: 77 mg/dL (ref 65–99)
Potassium: 4.1 mmol/L (ref 3.5–5.2)
Sodium: 140 mmol/L (ref 134–144)
Total Protein: 7.3 g/dL (ref 6.0–8.5)

## 2020-03-10 LAB — CBC
Hematocrit: 40.4 % (ref 37.5–51.0)
Hemoglobin: 13.5 g/dL (ref 13.0–17.7)
MCH: 29.2 pg (ref 26.6–33.0)
MCHC: 33.4 g/dL (ref 31.5–35.7)
MCV: 87 fL (ref 79–97)
Platelets: 225 10*3/uL (ref 150–450)
RBC: 4.62 x10E6/uL (ref 4.14–5.80)
RDW: 12.2 % (ref 11.6–15.4)
WBC: 2.6 10*3/uL — ABNORMAL LOW (ref 3.4–10.8)

## 2020-03-10 LAB — LIPID PANEL
Chol/HDL Ratio: 2.5 ratio (ref 0.0–5.0)
Cholesterol, Total: 172 mg/dL (ref 100–199)
HDL: 69 mg/dL (ref >39)
LDL Chol Calc (NIH): 95 mg/dL (ref 0–99)
Triglycerides: 39 mg/dL (ref 0–149)
VLDL Cholesterol Cal: 8 mg/dL (ref 5–40)

## 2020-03-10 NOTE — Progress Notes (Signed)
Report received from [facility]. Patient advised.

## 2020-03-11 ENCOUNTER — Other Ambulatory Visit: Payer: Self-pay

## 2020-03-12 ENCOUNTER — Other Ambulatory Visit: Payer: Self-pay

## 2020-03-12 ENCOUNTER — Telehealth: Payer: Self-pay

## 2020-03-12 DIAGNOSIS — D709 Neutropenia, unspecified: Secondary | ICD-10-CM

## 2020-03-12 NOTE — Telephone Encounter (Signed)
-----   Message from Erasmo Downer, MD sent at 03/12/2020  8:16 AM EDT ----- Patient has neutropenia (low neutrophils, which help fight infection).  Recommend evaluation by hematology. Ok to place referral

## 2020-03-12 NOTE — Telephone Encounter (Signed)
Patient advised and referral placed.   Dr Fabian Sharp I only saw the top one and didn't realize there was another result under it.

## 2020-03-12 NOTE — Telephone Encounter (Signed)
I just wanted to make sure you knew I called about the differential and they are supposed to be adding it.  Should we proceed with referral or wait on those results? Thanks

## 2020-03-12 NOTE — Telephone Encounter (Signed)
No problem... Thank you!

## 2020-03-12 NOTE — Telephone Encounter (Signed)
This is the result from the differential.  I have the results

## 2020-03-13 LAB — WHITE BLOOD COUNT AND DIFFERENTIAL
Basophils Absolute: 0 10*3/uL (ref 0.0–0.2)
Basos: 1 %
EOS (ABSOLUTE): 0.1 10*3/uL (ref 0.0–0.4)
Eos: 3 %
Immature Grans (Abs): 0 10*3/uL (ref 0.0–0.1)
Immature Granulocytes: 0 %
Lymphocytes Absolute: 1.6 10*3/uL (ref 0.7–3.1)
Lymphs: 59 %
Monocytes Absolute: 0.3 10*3/uL (ref 0.1–0.9)
Monocytes: 10 %
Neutrophils Absolute: 0.7 10*3/uL — ABNORMAL LOW (ref 1.4–7.0)
Neutrophils: 27 %
WBC: 2.7 10*3/uL — ABNORMAL LOW (ref 3.4–10.8)

## 2020-03-13 LAB — SPECIMEN STATUS REPORT

## 2020-03-19 ENCOUNTER — Encounter: Payer: Self-pay | Admitting: Oncology

## 2020-03-19 NOTE — Progress Notes (Signed)
Patient contacted for new patient visit. Directions to cancer center given. No concerns voiced.

## 2020-03-22 ENCOUNTER — Inpatient Hospital Stay: Payer: BC Managed Care – PPO

## 2020-03-22 ENCOUNTER — Inpatient Hospital Stay: Payer: BC Managed Care – PPO | Attending: Oncology | Admitting: Oncology

## 2020-03-22 VITALS — BP 113/76 | HR 85 | Temp 95.6°F | Resp 18 | Ht 70.0 in | Wt 182.9 lb

## 2020-03-22 DIAGNOSIS — D709 Neutropenia, unspecified: Secondary | ICD-10-CM | POA: Diagnosis not present

## 2020-03-22 DIAGNOSIS — Z808 Family history of malignant neoplasm of other organs or systems: Secondary | ICD-10-CM | POA: Diagnosis not present

## 2020-03-22 DIAGNOSIS — Z800 Family history of malignant neoplasm of digestive organs: Secondary | ICD-10-CM | POA: Diagnosis not present

## 2020-03-22 DIAGNOSIS — Z87891 Personal history of nicotine dependence: Secondary | ICD-10-CM | POA: Diagnosis not present

## 2020-03-22 DIAGNOSIS — Z809 Family history of malignant neoplasm, unspecified: Secondary | ICD-10-CM | POA: Insufficient documentation

## 2020-03-22 LAB — FOLATE: Folate: 13.8 ng/mL (ref >5.9)

## 2020-03-22 LAB — CBC WITH DIFFERENTIAL/PLATELET
Abs Immature Granulocytes: 0.01 10*3/uL (ref 0.00–0.07)
Basophils Absolute: 0 10*3/uL (ref 0.0–0.1)
Basophils Relative: 1 %
Eosinophils Absolute: 0.1 10*3/uL (ref 0.0–0.5)
Eosinophils Relative: 4 %
HCT: 40.4 % (ref 39.0–52.0)
Hemoglobin: 13.9 g/dL (ref 13.0–17.0)
Immature Granulocytes: 0 %
Lymphocytes Relative: 50 %
Lymphs Abs: 1.5 10*3/uL (ref 0.7–4.0)
MCH: 30 pg (ref 26.0–34.0)
MCHC: 34.4 g/dL (ref 30.0–36.0)
MCV: 87.3 fL (ref 80.0–100.0)
Monocytes Absolute: 0.2 10*3/uL (ref 0.1–1.0)
Monocytes Relative: 7 %
Neutro Abs: 1.2 10*3/uL — ABNORMAL LOW (ref 1.7–7.7)
Neutrophils Relative %: 38 %
Platelets: 225 10*3/uL (ref 150–400)
RBC: 4.63 MIL/uL (ref 4.22–5.81)
RDW: 12.1 % (ref 11.5–15.5)
WBC: 3 10*3/uL — ABNORMAL LOW (ref 4.0–10.5)
nRBC: 0 % (ref 0.0–0.2)

## 2020-03-22 LAB — HIV ANTIBODY (ROUTINE TESTING W REFLEX): HIV Screen 4th Generation wRfx: NONREACTIVE

## 2020-03-22 LAB — TECHNOLOGIST SMEAR REVIEW: Plt Morphology: ADEQUATE

## 2020-03-22 LAB — TSH: TSH: 0.712 u[IU]/mL (ref 0.350–4.500)

## 2020-03-22 LAB — HEPATITIS PANEL, ACUTE
HCV Ab: NONREACTIVE
Hep A IgM: NONREACTIVE
Hep B C IgM: NONREACTIVE
Hepatitis B Surface Ag: NONREACTIVE

## 2020-03-22 LAB — VITAMIN B12: Vitamin B-12: 198 pg/mL (ref 180–914)

## 2020-03-22 NOTE — Progress Notes (Signed)
Hematology/Oncology Consult note [facility] [phone] [phone]   Patient Care Team: Erasmo Downer, MD as PCP - General (Family Medicine)  REFERRING PROVIDER: Erasmo Downer, MD  CHIEF COMPLAINTS/REASON FOR VISIT:  Evaluation of Neutropenia  HISTORY OF PRESENTING ILLNESS:  Sean Butler is a 34 y.o. male who was seen in consultation at the request of Bacigalupo, Marzella Schlein, MD for evaluation of neutropenia.  Reviewed patient's recent labs.  03/09/2020 Patient has low total WBC count was 2.6  predominantly neutropenia, 0.7 Previous lab records reviewed. Leukopenia duration is acute onset.  No aggravating or improving factors.  Associated symptoms:  denies fatigue, weight loss, fever, chills, frequent infection.  History hepatitis or HIV infection: Denies History of chronic liver disease: Denies History of blood transfusion: Denies Alcohol consumption: Denies Diet Vegetarian or Vegan: Denies Herbal medication: Denies   Review of Systems  Constitutional: Negative for chills, diaphoresis, fatigue, fever and unexpected weight change.  HENT:   Negative for hearing loss, lump/mass, nosebleeds and sore throat.   Eyes: Negative for eye problems and icterus.  Respiratory: Negative for chest tightness, cough, hemoptysis, shortness of breath and wheezing.   Cardiovascular: Negative for chest pain and leg swelling.  Gastrointestinal: Negative for abdominal distention, abdominal pain, blood in stool, diarrhea, nausea and rectal pain.  Endocrine: Negative for hot flashes.  Genitourinary: Negative for bladder incontinence, difficulty urinating, dysuria, frequency, hematuria and nocturia.   Musculoskeletal: Negative for back pain, flank pain, gait problem and myalgias.  Skin: Negative for rash.  Neurological: Negative for dizziness, gait problem, headaches, numbness and seizures.  Hematological: Negative for adenopathy. Does not  bruise/bleed easily.  Psychiatric/Behavioral: Negative for confusion and decreased concentration. The patient is not nervous/anxious.     MEDICAL HISTORY:  Past Medical History:  Diagnosis Date  . History of colon polyps   . Overweight     SURGICAL HISTORY: Past Surgical History:  Procedure Laterality Date  . NO PAST SURGERIES      SOCIAL HISTORY: Social History   Socioeconomic History  . Marital status: Married    Spouse name: Pollie Meyer  . Number of children: 2  . Years of education: Not on file  . Highest education level: Some college, no degree  Occupational History  . Occupation: Realtor  Tobacco Use  . Smoking status: Former Smoker    Quit date: 03/19/2016    Years since quitting: 4.0  . Smokeless tobacco: Never Used  . Tobacco comment: smoked about once a month   Substance and Sexual Activity  . Alcohol use: Yes    Comment: 1 beer every 6 months  . Drug use: No  . Sexual activity: Yes    Partners: Female    Birth control/protection: None  Other Topics Concern  . Not on file  Social History Narrative  . Not on file   Social Determinants of Health   Financial Resource Strain:   . Difficulty of Paying Living Expenses:   Food Insecurity:   . Worried About Programme researcher, broadcasting/film/video in the Last Year:   . Barista in the Last Year:   Transportation Needs:   . Freight forwarder (Medical):   Marland Kitchen Lack of Transportation (Non-Medical):   Physical Activity:   . Days of Exercise per Week:   . Minutes of Exercise per Session:   Stress:   . Feeling of Stress :   Social Connections:   . Frequency of Communication with Friends and Family:   .  Frequency of Social Gatherings with Friends and Family:   . Attends Religious Services:   . Active Member of Clubs or Organizations:   . Attends Archivist Meetings:   Marland Kitchen Marital Status:   Intimate Partner Violence:   . Fear of Current or Ex-Partner:   . Emotionally Abused:   Marland Kitchen Physically Abused:   .  Sexually Abused:     FAMILY HISTORY: Family History  Problem Relation Age of Onset  . Healthy Mother   . Cancer Father        unknown type  . Healthy Sister   . Healthy Brother   . Liver cancer Maternal Grandmother   . Pancreatic cancer Maternal Grandmother   . Diabetes Maternal Grandfather   . Healthy Sister   . Colon cancer Neg Hx   . Prostate cancer Neg Hx     ALLERGIES:  has No Known Allergies.  MEDICATIONS:  No current outpatient medications on file.   No current facility-administered medications for this visit.     PHYSICAL EXAMINATION: ECOG PERFORMANCE STATUS: 0 - Asymptomatic Vitals:   03/22/20 1512  BP: 113/76  Pulse: 85  Resp: 18  Temp: (!) 95.6 F (35.3 C)   Filed Weights   03/22/20 1512  Weight: 182 lb 14.4 oz (83 kg)    Physical Exam Constitutional:      General: He is not in acute distress. HENT:     Head: Normocephalic and atraumatic.  Eyes:     General: No scleral icterus. Cardiovascular:     Rate and Rhythm: Normal rate and regular rhythm.     Heart sounds: Normal heart sounds.  Pulmonary:     Effort: Pulmonary effort is normal. No respiratory distress.     Breath sounds: No wheezing.  Abdominal:     General: Bowel sounds are normal. There is no distension.     Palpations: Abdomen is soft.  Musculoskeletal:        General: No deformity. Normal range of motion.     Cervical back: Normal range of motion and neck supple.  Skin:    General: Skin is warm and dry.     Findings: No erythema or rash.  Neurological:     Mental Status: He is alert and oriented to person, place, and time. Mental status is at baseline.     Cranial Nerves: No cranial nerve deficit.     Coordination: Coordination normal.  Psychiatric:        Mood and Affect: Mood normal.     CMP Latest Ref Rng & Units 03/09/2020  Glucose 65 - 99 mg/dL 77  BUN 6 - 20 mg/dL 16  Creatinine 0.76 - 1.27 mg/dL 0.88  Sodium 134 - 144 mmol/L 140  Potassium 3.5 - 5.2 mmol/L 4.1   Chloride 96 - 106 mmol/L 103  CO2 20 - 29 mmol/L 24  Calcium 8.7 - 10.2 mg/dL 9.5  Total Protein 6.0 - 8.5 g/dL 7.3  Total Bilirubin 0.0 - 1.2 mg/dL 0.5  Alkaline Phos 39 - 117 IU/L 52  AST 0 - 40 IU/L 14  ALT 0 - 44 IU/L 12   CBC Latest Ref Rng & Units 03/22/2020  WBC 4.0 - 10.5 K/uL 3.0(L)  Hemoglobin 13.0 - 17.0 g/dL 13.9  Hematocrit 39.0 - 52.0 % 40.4  Platelets 150 - 400 K/uL 225    LABORATORY DATA:  I have reviewed the data as listed Lab Results  Component Value Date   WBC 3.0 (L) 03/22/2020   HGB  13.9 03/22/2020   HCT 40.4 03/22/2020   MCV 87.3 03/22/2020   PLT 225 03/22/2020   Recent Labs    03/09/20 1046  NA 140  K 4.1  CL 103  CO2 24  GLUCOSE 77  BUN 16  CREATININE 0.88  CALCIUM 9.5  GFRNONAA 113  GFRAA 130  PROT 7.3  ALBUMIN 4.5  AST 14  ALT 12  ALKPHOS 52  BILITOT 0.5   Iron/TIBC/Ferritin/ %Sat No results found for: IRON, TIBC, FERRITIN, IRONPCTSAT   RADIOGRAPHIC STUDIES: I have personally reviewed the radiological images as listed and agreed with the findings in the report. No results found.    ASSESSMENT & PLAN:  1. Neutropenia, unspecified type (HCC)    I discussed with the patient about most recent CBC test. I explained to patient that neutropenia can be a result of many conditions, such as infection/inflammation, benign ethnic neutropenia, medication-related, nutrition deficiency, congenital.  First step is to repeat another CBC with differential to see if the neutropenia persists or not.  also send some basic lab work up including TSH,smear review,  folate, B12, ANA, flowcytometry, HIV and hepatitis panel. .  Patient can follow-up with me to discuss results and decide if any further workup/treatment is needed.   Orders Placed This Encounter  Procedures  . CBC with Differential/Platelet    Standing Status:   Future    Number of Occurrences:   1    Standing Expiration Date:   09/22/2021  . ANA, IFA (with reflex)    Standing  Status:   Future    Number of Occurrences:   1    Standing Expiration Date:   09/22/2021  . Flow cytometry panel-leukemia/lymphoma work-up    Standing Status:   Future    Number of Occurrences:   1    Standing Expiration Date:   09/22/2021  . Vitamin B12    Standing Status:   Future    Number of Occurrences:   1    Standing Expiration Date:   09/22/2021  . Folate    Standing Status:   Future    Number of Occurrences:   1    Standing Expiration Date:   09/22/2021  . HIV Antibody (routine testing w rflx)    Standing Status:   Future    Number of Occurrences:   1    Standing Expiration Date:   09/22/2021  . Hepatitis panel, acute    Standing Status:   Future    Number of Occurrences:   1    Standing Expiration Date:   09/22/2021  . TSH    Standing Status:   Future    Number of Occurrences:   1    Standing Expiration Date:   03/22/2021    All questions were answered. The patient knows to call the clinic with any problems questions or concerns.  Cc Bacigalupo, Marzella Schlein, MD  Return of visit: 2 weeks Thank you for this kind referral and the opportunity to participate in the care of this patient. A copy of today's note is routed to referring provider     Rickard Patience, MD, PhD Hematology Oncology [facility] Pager- [phone] 03/22/2020

## 2020-03-23 LAB — ANTINUCLEAR ANTIBODIES, IFA: ANA Ab, IFA: NEGATIVE

## 2020-03-25 LAB — COMP PANEL: LEUKEMIA/LYMPHOMA

## 2020-04-05 ENCOUNTER — Other Ambulatory Visit: Admission: RE | Admit: 2020-04-05 | Payer: BC Managed Care – PPO | Source: Ambulatory Visit

## 2020-04-06 ENCOUNTER — Encounter: Payer: Self-pay | Admitting: Oncology

## 2020-04-06 ENCOUNTER — Inpatient Hospital Stay: Payer: BC Managed Care – PPO | Attending: Oncology | Admitting: Oncology

## 2020-04-06 ENCOUNTER — Other Ambulatory Visit: Payer: Self-pay

## 2020-04-06 DIAGNOSIS — D709 Neutropenia, unspecified: Secondary | ICD-10-CM

## 2020-04-06 NOTE — Progress Notes (Signed)
HEMATOLOGY-ONCOLOGY TeleHEALTH VISIT PROGRESS NOTE  I connected with Sean Butler on 04/06/20 at  2:00 PM EDT by video enabled telemedicine visit and verified that I am speaking with the correct person using two identifiers. I discussed the limitations, risks, security and privacy concerns of performing an evaluation and management service by telemedicine and the availability of in-person appointments. I also discussed with the patient that there may be a patient responsible charge related to this service. The patient expressed understanding and agreed to proceed.   Other persons participating in the visit and their role in the encounter:  None  Patient's location: Home  Provider's location: office Chief Complaint: follow up for neutropenia   INTERVAL HISTORY Sean Butler is a 34 y.o. male who has above history reviewed by me today presents for follow up visit for management of neutropenia Problems and complaints are listed below:  He had blood work done and presents virtually to discuss results.   Review of Systems  Constitutional: Negative for appetite change, chills, fatigue, fever and unexpected weight change.  HENT:   Negative for hearing loss and voice change.   Eyes: Negative for eye problems and icterus.  Respiratory: Negative for chest tightness, cough and shortness of breath.   Cardiovascular: Negative for chest pain and leg swelling.  Gastrointestinal: Negative for abdominal distention and abdominal pain.  Endocrine: Negative for hot flashes.  Genitourinary: Negative for difficulty urinating, dysuria and frequency.   Musculoskeletal: Negative for arthralgias.  Skin: Negative for itching and rash.  Neurological: Negative for light-headedness and numbness.  Hematological: Negative for adenopathy. Does not bruise/bleed easily.  Psychiatric/Behavioral: Negative for confusion.    Past Medical History:  Diagnosis Date  . History of colon polyps   . Overweight    Past Surgical  History:  Procedure Laterality Date  . NO PAST SURGERIES      Family History  Problem Relation Age of Onset  . Healthy Mother   . Cancer Father        unknown type  . Healthy Sister   . Healthy Brother   . Liver cancer Maternal Grandmother   . Pancreatic cancer Maternal Grandmother   . Diabetes Maternal Grandfather   . Healthy Sister   . Colon cancer Neg Hx   . Prostate cancer Neg Hx     Social History   Socioeconomic History  . Marital status: Married    Spouse name: Gerre Scull  . Number of children: 2  . Years of education: Not on file  . Highest education level: Some college, no degree  Occupational History  . Occupation: Realtor  Tobacco Use  . Smoking status: Former Smoker    Quit date: 03/19/2016    Years since quitting: 4.0  . Smokeless tobacco: Never Used  . Tobacco comment: smoked about once a month   Substance and Sexual Activity  . Alcohol use: Yes    Comment: 1 beer every 6 months  . Drug use: No  . Sexual activity: Yes    Partners: Female    Birth control/protection: None  Other Topics Concern  . Not on file  Social History Narrative  . Not on file   Social Determinants of Health   Financial Resource Strain:   . Difficulty of Paying Living Expenses:   Food Insecurity:   . Worried About Charity fundraiser in the Last Year:   . Arboriculturist in the Last Year:   Transportation Needs:   . Film/video editor (Medical):   Marland Kitchen  Lack of Transportation (Non-Medical):   Physical Activity:   . Days of Exercise per Week:   . Minutes of Exercise per Session:   Stress:   . Feeling of Stress :   Social Connections:   . Frequency of Communication with Friends and Family:   . Frequency of Social Gatherings with Friends and Family:   . Attends Religious Services:   . Active Member of Clubs or Organizations:   . Attends Banker Meetings:   Marland Kitchen Marital Status:   Intimate Partner Violence:   . Fear of Current or Ex-Partner:   .  Emotionally Abused:   Marland Kitchen Physically Abused:   . Sexually Abused:     No current outpatient medications on file prior to visit.   No current facility-administered medications on file prior to visit.    No Known Allergies     Observations/Objective: Today's Vitals   04/06/20 1340  PainSc: 0-No pain   There is no height or weight on file to calculate BMI.  Physical Exam  Constitutional: No distress.  Neurological: He is alert.    CBC    Component Value Date/Time   WBC 3.0 (L) 03/22/2020 1550   RBC 4.63 03/22/2020 1550   HGB 13.9 03/22/2020 1550   HGB 13.5 03/09/2020 1046   HCT 40.4 03/22/2020 1550   HCT 40.4 03/09/2020 1046   PLT 225 03/22/2020 1550   PLT 225 03/09/2020 1046   MCV 87.3 03/22/2020 1550   MCV 87 03/09/2020 1046   MCH 30.0 03/22/2020 1550   MCHC 34.4 03/22/2020 1550   RDW 12.1 03/22/2020 1550   RDW 12.2 03/09/2020 1046   LYMPHSABS 1.5 03/22/2020 1550   LYMPHSABS 1.6 03/09/2020 1046   MONOABS 0.2 03/22/2020 1550   EOSABS 0.1 03/22/2020 1550   EOSABS 0.1 03/09/2020 1046   BASOSABS 0.0 03/22/2020 1550   BASOSABS 0.0 03/09/2020 1046    CMP     Component Value Date/Time   NA 140 03/09/2020 1046   K 4.1 03/09/2020 1046   CL 103 03/09/2020 1046   CO2 24 03/09/2020 1046   GLUCOSE 77 03/09/2020 1046   BUN 16 03/09/2020 1046   CREATININE 0.88 03/09/2020 1046   CALCIUM 9.5 03/09/2020 1046   PROT 7.3 03/09/2020 1046   ALBUMIN 4.5 03/09/2020 1046   AST 14 03/09/2020 1046   ALT 12 03/09/2020 1046   ALKPHOS 52 03/09/2020 1046   BILITOT 0.5 03/09/2020 1046   GFRNONAA 113 03/09/2020 1046   GFRAA 130 03/09/2020 1046     Assessment and Plan: 1. Neutropenia, unspecified type [facility])     Labs are reviewed and discussed with patient. Unremarkable smear.  Negative ANA and peripheral blood flowcytometry.  Low normal end vitamin b12, normal folate.  Advise him to take OTC vitamin b12 supplementation.  Negative HIV and hepatitis.  CBC showed improved  neutropenia.  He is asymptomatic, likely recent neutropenia is due to acute infection, reactive process.  Continue to monitor .  Follow Up Instructions: 4 months.    I discussed the assessment and treatment plan with the patient. The patient was provided an opportunity to ask questions and all were answered. The patient agreed with the plan and demonstrated an understanding of the instructions.  The patient was advised to call back or seek an in-person evaluation if the symptoms worsen or if the condition fails to improve as anticipated.    Rickard Patience, MD 04/06/2020 10:53 PM

## 2020-04-06 NOTE — Progress Notes (Signed)
Patient verified using two identifiers for virtual visit via telephone today.  Patient does not offer any problems today.  

## 2020-04-07 ENCOUNTER — Encounter: Admission: RE | Payer: Self-pay | Source: Home / Self Care

## 2020-04-07 ENCOUNTER — Ambulatory Visit
Admission: RE | Admit: 2020-04-07 | Payer: BC Managed Care – PPO | Source: Home / Self Care | Admitting: Gastroenterology

## 2020-04-07 SURGERY — COLONOSCOPY WITH PROPOFOL
Anesthesia: General

## 2020-08-06 ENCOUNTER — Other Ambulatory Visit: Payer: Self-pay

## 2020-08-06 ENCOUNTER — Inpatient Hospital Stay: Payer: BC Managed Care – PPO | Attending: Oncology

## 2020-08-06 ENCOUNTER — Inpatient Hospital Stay (HOSPITAL_BASED_OUTPATIENT_CLINIC_OR_DEPARTMENT_OTHER): Payer: BC Managed Care – PPO | Admitting: Oncology

## 2020-08-06 ENCOUNTER — Encounter: Payer: Self-pay | Admitting: Oncology

## 2020-08-06 DIAGNOSIS — D709 Neutropenia, unspecified: Secondary | ICD-10-CM | POA: Diagnosis not present

## 2020-08-06 LAB — CBC WITH DIFFERENTIAL/PLATELET
Abs Immature Granulocytes: 0 10*3/uL (ref 0.00–0.07)
Basophils Absolute: 0 10*3/uL (ref 0.0–0.1)
Basophils Relative: 1 %
Eosinophils Absolute: 0.1 10*3/uL (ref 0.0–0.5)
Eosinophils Relative: 3 %
HCT: 37.2 % — ABNORMAL LOW (ref 39.0–52.0)
Hemoglobin: 13 g/dL (ref 13.0–17.0)
Immature Granulocytes: 0 %
Lymphocytes Relative: 48 %
Lymphs Abs: 1.1 10*3/uL (ref 0.7–4.0)
MCH: 30.1 pg (ref 26.0–34.0)
MCHC: 34.9 g/dL (ref 30.0–36.0)
MCV: 86.1 fL (ref 80.0–100.0)
Monocytes Absolute: 0.2 10*3/uL (ref 0.1–1.0)
Monocytes Relative: 10 %
Neutro Abs: 0.8 10*3/uL — ABNORMAL LOW (ref 1.7–7.7)
Neutrophils Relative %: 38 %
Platelets: 185 10*3/uL (ref 150–400)
RBC: 4.32 MIL/uL (ref 4.22–5.81)
RDW: 11.9 % (ref 11.5–15.5)
WBC: 2.2 10*3/uL — ABNORMAL LOW (ref 4.0–10.5)
nRBC: 0 % (ref 0.0–0.2)

## 2020-08-06 LAB — VITAMIN B12: Vitamin B-12: 361 pg/mL (ref 180–914)

## 2020-08-06 NOTE — Progress Notes (Signed)
HEMATOLOGY-ONCOLOGY TeleHEALTH VISIT PROGRESS NOTE  I connected with Sean Butler on 08/06/20 at  2:00 PM EDT by video enabled telemedicine visit and verified that I am speaking with the correct person using two identifiers. I discussed the limitations, risks, security and privacy concerns of performing an evaluation and management service by telemedicine and the availability of in-person appointments. I also discussed with the patient that there may be a patient responsible charge related to this service. The patient expressed understanding and agreed to proceed.   Other persons participating in the visit and their role in the encounter:  None  Patient's location: Patient was in his car.  Not driving Provider's location: office Chief Complaint: follow up for neutropenia   INTERVAL HISTORY Sean Butler is a 34 y.o. male who has above history reviewed by me today presents for follow up visit for management of neutropenia Problems and complaints are listed below:  I attempted to connect the patient for visual enabled telehealth visit.  Due to the technical difficulties with video,  Patient was transitioned to audio only visit. Reports no new complaints.  Denies any recent alcohol use.  Denies any frequent infection, fever, chills, unintentional weight loss, night sweats,    Review of Systems  Constitutional: Negative for appetite change, chills, fatigue, fever and unexpected weight change.  HENT:   Negative for hearing loss and voice change.   Eyes: Negative for eye problems and icterus.  Respiratory: Negative for chest tightness, cough and shortness of breath.   Cardiovascular: Negative for chest pain and leg swelling.  Gastrointestinal: Negative for abdominal distention and abdominal pain.  Endocrine: Negative for hot flashes.  Genitourinary: Negative for difficulty urinating, dysuria and frequency.   Musculoskeletal: Negative for arthralgias.  Skin: Negative for itching and rash.   Neurological: Negative for light-headedness and numbness.  Hematological: Negative for adenopathy. Does not bruise/bleed easily.  Psychiatric/Behavioral: Negative for confusion.    Past Medical History:  Diagnosis Date  . History of colon polyps   . Overweight    Past Surgical History:  Procedure Laterality Date  . NO PAST SURGERIES      Family History  Problem Relation Age of Onset  . Healthy Mother   . Cancer Father        unknown type  . Healthy Sister   . Healthy Brother   . Liver cancer Maternal Grandmother   . Pancreatic cancer Maternal Grandmother   . Diabetes Maternal Grandfather   . Healthy Sister   . Colon cancer Neg Hx   . Prostate cancer Neg Hx     Social History   Socioeconomic History  . Marital status: Married    Spouse name: Sean Butler  . Number of children: 2  . Years of education: Not on file  . Highest education level: Some college, no degree  Occupational History  . Occupation: Realtor  Tobacco Use  . Smoking status: Former Smoker    Quit date: 03/19/2016    Years since quitting: 4.3  . Smokeless tobacco: Never Used  . Tobacco comment: smoked about once a month   Vaping Use  . Vaping Use: Never used  Substance and Sexual Activity  . Alcohol use: Yes    Comment: 1 beer every 6 months  . Drug use: No  . Sexual activity: Yes    Partners: Female    Birth control/protection: None  Other Topics Concern  . Not on file  Social History Narrative  . Not on file   Social Determinants of  Health   Financial Resource Strain:   . Difficulty of Paying Living Expenses:   Food Insecurity:   . Worried About Programme researcher, broadcasting/film/video in the Last Year:   . Barista in the Last Year:   Transportation Needs:   . Freight forwarder (Medical):   Marland Kitchen Lack of Transportation (Non-Medical):   Physical Activity:   . Days of Exercise per Week:   . Minutes of Exercise per Session:   Stress:   . Feeling of Stress :   Social Connections:   .  Frequency of Communication with Friends and Family:   . Frequency of Social Gatherings with Friends and Family:   . Attends Religious Services:   . Active Member of Clubs or Organizations:   . Attends Banker Meetings:   Marland Kitchen Marital Status:   Intimate Partner Violence:   . Fear of Current or Ex-Partner:   . Emotionally Abused:   Marland Kitchen Physically Abused:   . Sexually Abused:     No current outpatient medications on file prior to visit.   No current facility-administered medications on file prior to visit.    No Known Allergies     Observations/Objective: There were no vitals filed for this visit. There is no height or weight on file to calculate BMI.  Physical Exam  CBC    Component Value Date/Time   WBC 2.2 (L) 08/06/2020 1106   RBC 4.32 08/06/2020 1106   HGB 13.0 08/06/2020 1106   HGB 13.5 03/09/2020 1046   HCT 37.2 (L) 08/06/2020 1106   HCT 40.4 03/09/2020 1046   PLT 185 08/06/2020 1106   PLT 225 03/09/2020 1046   MCV 86.1 08/06/2020 1106   MCV 87 03/09/2020 1046   MCH 30.1 08/06/2020 1106   MCHC 34.9 08/06/2020 1106   RDW 11.9 08/06/2020 1106   RDW 12.2 03/09/2020 1046   LYMPHSABS 1.1 08/06/2020 1106   LYMPHSABS 1.6 03/09/2020 1046   MONOABS 0.2 08/06/2020 1106   EOSABS 0.1 08/06/2020 1106   EOSABS 0.1 03/09/2020 1046   BASOSABS 0.0 08/06/2020 1106   BASOSABS 0.0 03/09/2020 1046    CMP     Component Value Date/Time   NA 140 03/09/2020 1046   K 4.1 03/09/2020 1046   CL 103 03/09/2020 1046   CO2 24 03/09/2020 1046   GLUCOSE 77 03/09/2020 1046   BUN 16 03/09/2020 1046   CREATININE 0.88 03/09/2020 1046   CALCIUM 9.5 03/09/2020 1046   PROT 7.3 03/09/2020 1046   ALBUMIN 4.5 03/09/2020 1046   AST 14 03/09/2020 1046   ALT 12 03/09/2020 1046   ALKPHOS 52 03/09/2020 1046   BILITOT 0.5 03/09/2020 1046   GFRNONAA 113 03/09/2020 1046   GFRAA 130 03/09/2020 1046     Assessment and Plan: 1. Neutropenia, unspecified type [facility])     Labs reviewed and  discussed with patient. Chronic neutropenia, previous work-up was nonremarkable including negative ANA, negative peripheral blood flow cytometry, normal folate level.  Negative HIV or hepatitis. He has had low normal end vitamin B12 level and has been taking oral vitamin B12 supplementation on most of the days. Vitamin B12 level has improved to 361. Today CBC showed leukopenia with ANC of 0.8.  Ethnic neutropenia versus underlying marrow disorders.  I discussed with patient about bone marrow biopsy for further evaluation and he declined.  He preferred to be observed given that he has no symptoms. We will have him follow-up in 6 months.   Follow Up  Instructions: 6 months   I discussed the assessment and treatment plan with the patient. The patient was provided an opportunity to ask questions and all were answered. The patient agreed with the plan and demonstrated an understanding of the instructions.  The patient was advised to call back or seek an in-person evaluation if the symptoms worsen or if the condition fails to improve as anticipated.    Earlie Server, MD 08/06/2020 5:21 PM

## 2020-08-06 NOTE — Progress Notes (Signed)
Chart reviewed and updated prior to Mychart visit. No new concerns voiced.

## 2020-08-06 NOTE — Addendum Note (Signed)
Addended by: Rickard Patience on: 08/06/2020 05:40 PM   Modules accepted: Orders

## 2020-08-09 ENCOUNTER — Telehealth: Payer: Self-pay

## 2020-08-09 NOTE — Telephone Encounter (Signed)
-----   Message from Rickard Patience, MD sent at 08/06/2020  5:31 PM EDT ----- Please let him know that vitamin B12 level has improved.  Advised patient continue oral vitamin B 12 supplementation.  Please arrange him to follow-up in 6 months, lab MD.  Labs prior, ordered.  Thank you.

## 2020-08-09 NOTE — Telephone Encounter (Signed)
Pt notified. Message sent to scheduling pool.

## 2021-02-07 ENCOUNTER — Inpatient Hospital Stay: Payer: BC Managed Care – PPO

## 2021-02-07 DIAGNOSIS — D709 Neutropenia, unspecified: Secondary | ICD-10-CM

## 2021-02-07 DIAGNOSIS — Z87891 Personal history of nicotine dependence: Secondary | ICD-10-CM | POA: Insufficient documentation

## 2021-02-07 DIAGNOSIS — E538 Deficiency of other specified B group vitamins: Secondary | ICD-10-CM | POA: Diagnosis not present

## 2021-02-07 LAB — CBC WITH DIFFERENTIAL/PLATELET
Abs Immature Granulocytes: 0 10*3/uL (ref 0.00–0.07)
Basophils Absolute: 0 10*3/uL (ref 0.0–0.1)
Basophils Relative: 1 %
Eosinophils Absolute: 0.2 10*3/uL (ref 0.0–0.5)
Eosinophils Relative: 5 %
HCT: 40.1 % (ref 39.0–52.0)
Hemoglobin: 13.7 g/dL (ref 13.0–17.0)
Immature Granulocytes: 0 %
Lymphocytes Relative: 53 %
Lymphs Abs: 1.7 10*3/uL (ref 0.7–4.0)
MCH: 29.8 pg (ref 26.0–34.0)
MCHC: 34.2 g/dL (ref 30.0–36.0)
MCV: 87.2 fL (ref 80.0–100.0)
Monocytes Absolute: 0.3 10*3/uL (ref 0.1–1.0)
Monocytes Relative: 8 %
Neutro Abs: 1.1 10*3/uL — ABNORMAL LOW (ref 1.7–7.7)
Neutrophils Relative %: 33 %
Platelets: 214 10*3/uL (ref 150–400)
RBC: 4.6 MIL/uL (ref 4.22–5.81)
RDW: 12.8 % (ref 11.5–15.5)
WBC: 3.2 10*3/uL — ABNORMAL LOW (ref 4.0–10.5)
nRBC: 0 % (ref 0.0–0.2)

## 2021-02-07 LAB — COMPREHENSIVE METABOLIC PANEL
ALT: 14 U/L (ref 0–44)
AST: 15 U/L (ref 15–41)
Albumin: 4.2 g/dL (ref 3.5–5.0)
Alkaline Phosphatase: 46 U/L (ref 38–126)
Anion gap: 10 (ref 5–15)
BUN: 22 mg/dL — ABNORMAL HIGH (ref 6–20)
CO2: 27 mmol/L (ref 22–32)
Calcium: 9 mg/dL (ref 8.9–10.3)
Chloride: 102 mmol/L (ref 98–111)
Creatinine, Ser: 0.8 mg/dL (ref 0.61–1.24)
GFR, Estimated: >60 mL/min (ref >60)
Glucose, Bld: 88 mg/dL (ref 70–99)
Potassium: 4 mmol/L (ref 3.5–5.1)
Sodium: 139 mmol/L (ref 135–145)
Total Bilirubin: 0.7 mg/dL (ref 0.3–1.2)
Total Protein: 7.5 g/dL (ref 6.5–8.1)

## 2021-02-07 LAB — VITAMIN B12: Vitamin B-12: 187 pg/mL (ref 180–914)

## 2021-02-08 LAB — ANCA TITERS
Atypical P-ANCA titer: <1:20 {titer}
C-ANCA: <1:20 {titer}
P-ANCA: <1:20 {titer}

## 2021-02-09 ENCOUNTER — Inpatient Hospital Stay: Payer: BC Managed Care – PPO | Attending: Oncology | Admitting: Oncology

## 2021-02-09 ENCOUNTER — Encounter: Payer: Self-pay | Admitting: Oncology

## 2021-02-09 ENCOUNTER — Other Ambulatory Visit: Payer: Self-pay

## 2021-02-09 VITALS — BP 113/71 | HR 78 | Temp 97.2°F | Resp 18 | Wt 176.7 lb

## 2021-02-09 DIAGNOSIS — E538 Deficiency of other specified B group vitamins: Secondary | ICD-10-CM | POA: Diagnosis not present

## 2021-02-09 DIAGNOSIS — Z87891 Personal history of nicotine dependence: Secondary | ICD-10-CM | POA: Diagnosis not present

## 2021-02-09 DIAGNOSIS — D709 Neutropenia, unspecified: Secondary | ICD-10-CM

## 2021-02-09 MED ORDER — VITAMIN B-12 1000 MCG PO TABS: 1000.0000 ug | ORAL_TABLET | Freq: Every day | ORAL | 5 refills | Status: AC

## 2021-02-09 NOTE — Progress Notes (Signed)
Patient here for oncology follow-up appointment, expresses no complaints or concerns at this time.    

## 2021-02-09 NOTE — Progress Notes (Signed)
Hematology/Oncology Consult note [facility] [phone] [phone]   Patient Care Team: Erasmo Downer, MD as PCP - General (Family Medicine)  REFERRING PROVIDER: Erasmo Downer, MD  CHIEF COMPLAINTS/REASON FOR VISIT:  Evaluation of Neutropenia  HISTORY OF PRESENTING ILLNESS:  Saveon Plant is a 35 y.o. male who was seen in consultation at the request of Bacigalupo, Marzella Schlein, MD for evaluation of neutropenia.  Reviewed patient's recent labs.  03/09/2020 Patient has low total WBC count was 2.6  predominantly neutropenia, 0.7 Previous lab records reviewed. Leukopenia duration is acute onset.  No aggravating or improving factors.  Associated symptoms:  denies fatigue, weight loss, fever, chills, frequent infection.  History hepatitis or HIV infection: Denies History of chronic liver disease: Denies History of blood transfusion: Denies Alcohol consumption: Denies Diet Vegetarian or Vegan: Denies Herbal medication: Denies  INTERVAL HISTORY Napolean Sia is a 35 y.o. male who has above history reviewed by me today presents for follow up visit for management of neutropenia Problems and complaints are listed below: Patient reports feeling well today.  No new complaints.  Review of Systems  Constitutional: Negative for chills, diaphoresis, fatigue, fever and unexpected weight change.  HENT:   Negative for hearing loss, lump/mass, nosebleeds and sore throat.   Eyes: Negative for eye problems and icterus.  Respiratory: Negative for chest tightness, cough, hemoptysis, shortness of breath and wheezing.   Cardiovascular: Negative for chest pain and leg swelling.  Gastrointestinal: Negative for abdominal distention, abdominal pain, blood in stool, diarrhea, nausea and rectal pain.  Endocrine: Negative for hot flashes.  Genitourinary: Negative for bladder incontinence, difficulty urinating, dysuria, frequency, hematuria and nocturia.    Musculoskeletal: Negative for back pain, flank pain, gait problem and myalgias.  Skin: Negative for rash.  Neurological: Negative for dizziness, gait problem, headaches, numbness and seizures.  Hematological: Negative for adenopathy. Does not bruise/bleed easily.  Psychiatric/Behavioral: Negative for confusion and decreased concentration. The patient is not nervous/anxious.     MEDICAL HISTORY:  Past Medical History:  Diagnosis Date  . History of colon polyps   . Overweight     SURGICAL HISTORY: Past Surgical History:  Procedure Laterality Date  . NO PAST SURGERIES      SOCIAL HISTORY: Social History   Socioeconomic History  . Marital status: Married    Spouse name: Pollie Meyer  . Number of children: 2  . Years of education: Not on file  . Highest education level: Some college, no degree  Occupational History  . Occupation: Realtor  Tobacco Use  . Smoking status: Former Smoker    Quit date: 03/19/2016    Years since quitting: 4.8  . Smokeless tobacco: Never Used  . Tobacco comment: smoked about once a month   Vaping Use  . Vaping Use: Never used  Substance and Sexual Activity  . Alcohol use: Yes    Comment: 1 beer every 6 months  . Drug use: No  . Sexual activity: Yes    Partners: Female    Birth control/protection: None  Other Topics Concern  . Not on file  Social History Narrative  . Not on file   Social Determinants of Health   Financial Resource Strain: Not on file  Food Insecurity: Not on file  Transportation Needs: Not on file  Physical Activity: Not on file  Stress: Not on file  Social Connections: Not on file  Intimate Partner Violence: Not on file    FAMILY HISTORY: Family History  Problem Relation Age of  Onset  . Healthy Mother   . Cancer Father        unknown type  . Healthy Sister   . Healthy Brother   . Liver cancer Maternal Grandmother   . Pancreatic cancer Maternal Grandmother   . Diabetes Maternal Grandfather   . Healthy  Sister   . Colon cancer Neg Hx   . Prostate cancer Neg Hx     ALLERGIES:  has No Known Allergies.  MEDICATIONS:  Current Outpatient Medications  Medication Sig Dispense Refill  . Multiple Vitamin (MULTIVITAMIN) tablet Take 1 tablet by mouth daily.    . vitamin B-12 (CYANOCOBALAMIN) 1000 MCG tablet Take 1 tablet (1,000 mcg total) by mouth daily. 30 tablet 5   No current facility-administered medications for this visit.     PHYSICAL EXAMINATION: ECOG PERFORMANCE STATUS: 0 - Asymptomatic Vitals:   02/09/21 1018  BP: 113/71  Pulse: 78  Resp: 18  Temp: (!) 97.2 F (36.2 C)  SpO2: 100%   Filed Weights   02/09/21 1018  Weight: 176 lb 11.2 oz (80.2 kg)    Physical Exam Constitutional:      General: He is not in acute distress. HENT:     Head: Normocephalic and atraumatic.  Eyes:     General: No scleral icterus. Cardiovascular:     Rate and Rhythm: Normal rate and regular rhythm.     Heart sounds: Normal heart sounds.  Pulmonary:     Effort: Pulmonary effort is normal. No respiratory distress.     Breath sounds: No wheezing.  Abdominal:     General: Bowel sounds are normal. There is no distension.     Palpations: Abdomen is soft.  Musculoskeletal:        General: No deformity. Normal range of motion.     Cervical back: Normal range of motion and neck supple.  Skin:    General: Skin is warm and dry.     Findings: No erythema or rash.  Neurological:     Mental Status: He is alert and oriented to person, place, and time. Mental status is at baseline.     Cranial Nerves: No cranial nerve deficit.     Coordination: Coordination normal.  Psychiatric:        Mood and Affect: Mood normal.     CMP Latest Ref Rng & Units 02/07/2021  Glucose 70 - 99 mg/dL 88  BUN 6 - 20 mg/dL 76(O)  Creatinine 1.15 - 1.24 mg/dL 7.26  Sodium 203 - 559 mmol/L 139  Potassium 3.5 - 5.1 mmol/L 4.0  Chloride 98 - 111 mmol/L 102  CO2 22 - 32 mmol/L 27  Calcium 8.9 - 10.3 mg/dL 9.0  Total  Protein 6.5 - 8.1 g/dL 7.5  Total Bilirubin 0.3 - 1.2 mg/dL 0.7  Alkaline Phos 38 - 126 U/L 46  AST 15 - 41 U/L 15  ALT 0 - 44 U/L 14   CBC Latest Ref Rng & Units 02/07/2021  WBC 4.0 - 10.5 K/uL 3.2(L)  Hemoglobin 13.0 - 17.0 g/dL 74.1  Hematocrit 63.8 - 52.0 % 40.1  Platelets 150 - 400 K/uL 214    LABORATORY DATA:  I have reviewed the data as listed Lab Results  Component Value Date   WBC 3.2 (L) 02/07/2021   HGB 13.7 02/07/2021   HCT 40.1 02/07/2021   MCV 87.2 02/07/2021   PLT 214 02/07/2021   Recent Labs    03/09/20 1046 02/07/21 1109  NA 140 139  K 4.1 4.0  CL  103 102  CO2 24 27  GLUCOSE 77 88  BUN 16 22*  CREATININE 0.88 0.80  CALCIUM 9.5 9.0  GFRNONAA 113 >60  GFRAA 130  --   PROT 7.3 7.5  ALBUMIN 4.5 4.2  AST 14 15  ALT 12 14  ALKPHOS 52 46  BILITOT 0.5 0.7   Iron/TIBC/Ferritin/ %Sat No results found for: IRON, TIBC, FERRITIN, IRONPCTSAT   RADIOGRAPHIC STUDIES: I have personally reviewed the radiological images as listed and agreed with the findings in the report. No results found.    ASSESSMENT & PLAN:  1. Neutropenia, unspecified type (HCC)   2. B12 deficiency    #Neutropenia, Patient has had extensive work-up previously and results are nonremarkable. negative ANA, negative peripheral blood flow cytometry, normal folate level.  Negative HIV or hepatitis. Labs reviewed and discussed with patient. ANC is 1 today. Recommend continue observation.  Vitamin B12 is borderline low.  Patient admits not taking oral vitamin B12 supplementation.  Is motivated to go back on B12 supplementation.  Refills of B12 sent to pharmacy.   Orders Placed This Encounter  Procedures  . CBC with Differential/Platelet    Standing Status:   Future    Standing Expiration Date:   02/09/2022  . Comprehensive metabolic panel    Standing Status:   Future    Standing Expiration Date:   02/09/2022  . Vitamin B12    Standing Status:   Future    Standing Expiration Date:    02/09/2022    All questions were answered. The patient knows to call the clinic with any problems questions or concerns.  Cc Bacigalupo, Marzella Schlein, MD  Return of visit: 6 months     Rickard Patience, MD, PhD Hematology Oncology [facility] Pager- [phone] 02/09/2021

## 2021-02-10 LAB — COPPER, SERUM: Copper: 65 ug/dL — ABNORMAL LOW (ref 69–132)

## 2021-02-15 ENCOUNTER — Telehealth: Payer: Self-pay

## 2021-02-15 DIAGNOSIS — E538 Deficiency of other specified B group vitamins: Secondary | ICD-10-CM

## 2021-02-15 DIAGNOSIS — D709 Neutropenia, unspecified: Secondary | ICD-10-CM

## 2021-02-15 NOTE — Telephone Encounter (Signed)
-----   Message from Rickard Patience, MD sent at 02/14/2021  9:47 PM EST ----- Please let him know that copper level is mildly low. Recommend over the counter copper supplementation. Also please add copper lab to his next visit. Thanks.

## 2021-02-15 NOTE — Telephone Encounter (Signed)
Patient notified

## 2021-03-09 ENCOUNTER — Encounter: Payer: Self-pay | Admitting: Family Medicine

## 2021-03-17 ENCOUNTER — Other Ambulatory Visit: Payer: Self-pay

## 2021-03-17 ENCOUNTER — Ambulatory Visit (INDEPENDENT_AMBULATORY_CARE_PROVIDER_SITE_OTHER): Payer: BC Managed Care – PPO | Admitting: Family Medicine

## 2021-03-17 ENCOUNTER — Encounter: Payer: Self-pay | Admitting: Family Medicine

## 2021-03-17 VITALS — BP 118/74 | HR 72 | Temp 98.6°F | Resp 16 | Ht 70.0 in | Wt 173.0 lb

## 2021-03-17 DIAGNOSIS — Z0000 Encounter for general adult medical examination without abnormal findings: Secondary | ICD-10-CM

## 2021-03-17 NOTE — Patient Instructions (Signed)
Preventive Care 21-34 Years Old, Male Preventive care refers to lifestyle choices and visits with your health care provider that can promote health and wellness. This includes:  A yearly physical exam. This is also called an annual wellness visit.  Regular dental and eye exams.  Immunizations.  Screening for certain conditions.  Healthy lifestyle choices, such as: ? Eating a healthy diet. ? Getting regular exercise. ? Not using drugs or products that contain nicotine and tobacco. ? Limiting alcohol use. What can I expect for my preventive care visit? Physical exam Your health care provider may check your:  Height and weight. These may be used to calculate your BMI (body mass index). BMI is a measurement that tells if you are at a healthy weight.  Heart rate and blood pressure.  Body temperature.  Skin for abnormal spots. Counseling Your health care provider may ask you questions about your:  Past medical problems.  Family's medical history.  Alcohol, tobacco, and drug use.  Emotional well-being.  Home life and relationship well-being.  Sexual activity.  Diet, exercise, and sleep habits.  Work and work environment.  Access to firearms. What immunizations do I need? Vaccines are usually given at various ages, according to a schedule. Your health care provider will recommend vaccines for you based on your age, medical history, and lifestyle or other factors, such as travel or where you work.   What tests do I need? Blood tests  Lipid and cholesterol levels. These may be checked every 5 years starting at age 20.  Hepatitis C test.  Hepatitis B test. Screening  Diabetes screening. This is done by checking your blood sugar (glucose) after you have not eaten for a while (fasting).  Genital exam to check for testicular cancer or hernias.  STD (sexually transmitted disease) testing, if you are at risk. Talk with your health care provider about your test results,  treatment options, and if necessary, the need for more tests.   Follow these instructions at home: Eating and drinking  Eat a healthy diet that includes fresh fruits and vegetables, whole grains, lean protein, and low-fat dairy products.  Drink enough fluid to keep your urine pale yellow.  Take vitamin and mineral supplements as recommended by your health care provider.  Do not drink alcohol if your health care provider tells you not to drink.  If you drink alcohol: ? Limit how much you have to 0-2 drinks a day. ? Be aware of how much alcohol is in your drink. In the [place] one drink equals one 12 oz bottle of beer (355 mL), one 5 oz glass of wine (148 mL), or one 1 oz glass of hard liquor (44 mL).   Lifestyle  Take daily care of your teeth and gums. Brush your teeth every morning and night with fluoride toothpaste. Floss one time each day.  Stay active. Exercise for at least 30 minutes 5 or more days each week.  Do not use any products that contain nicotine or tobacco, such as cigarettes, e-cigarettes, and chewing tobacco. If you need help quitting, ask your health care provider.  Do not use drugs.  If you are sexually active, practice safe sex. Use a condom or other form of protection to prevent STIs (sexually transmitted infections).  Find healthy ways to cope with stress, such as: ? Meditation, yoga, or listening to music. ? Journaling. ? Talking to a trusted person. ? Spending time with friends and family. Safety  Always wear your seat belt while driving   or riding in a vehicle.  Do not drive: ? If you have been drinking alcohol. Do not ride with someone who has been drinking. ? When you are tired or distracted. ? While texting.  Wear a helmet and other protective equipment during sports activities.  If you have firearms in your house, make sure you follow all gun safety procedures.  Seek help if you have been physically or sexually abused. What's next?  Go to your  health care provider once a year for an annual wellness visit.  Ask your health care provider how often you should have your eyes and teeth checked.  Stay up to date on all vaccines. This information is not intended to replace advice given to you by your health care provider. Make sure you discuss any questions you have with your health care provider. Document Revised: 08/27/2019 Document Reviewed: 12/05/2018 Elsevier Patient Education  2021 Elsevier Inc.  

## 2021-03-17 NOTE — Progress Notes (Signed)
Complete physical exam   Patient: Sean Butler   DOB: 1986/10/28   35 y.o. Male  MRN: [id number] Visit Date: 03/17/2021  Today's healthcare provider: Shirlee Latch, MD   Chief Complaint  Patient presents with  . Annual Exam   Subjective    Sean Butler is a 35 y.o. male who presents today for a complete physical exam.  He reports consuming a general diet. Home exercise routine includes walking 1 hrs per day. He generally feels well. He reports sleeping well. He does not have additional problems to discuss today.    Past Medical History:  Diagnosis Date  . History of colon polyps   . Overweight    Past Surgical History:  Procedure Laterality Date  . NO PAST SURGERIES     Social History   Socioeconomic History  . Marital status: Married    Spouse name: Pollie Meyer  . Number of children: 2  . Years of education: Not on file  . Highest education level: Some college, no degree  Occupational History  . Occupation: Realtor  Tobacco Use  . Smoking status: Former Smoker    Quit date: 03/19/2016    Years since quitting: 4.9  . Smokeless tobacco: Never Used  . Tobacco comment: smoked about once a month   Vaping Use  . Vaping Use: Never used  Substance and Sexual Activity  . Alcohol use: Yes    Comment: 1 beer every 6 months  . Drug use: No  . Sexual activity: Yes    Partners: Female    Birth control/protection: None  Other Topics Concern  . Not on file  Social History Narrative  . Not on file   Social Determinants of Health   Financial Resource Strain: Not on file  Food Insecurity: Not on file  Transportation Needs: Not on file  Physical Activity: Not on file  Stress: Not on file  Social Connections: Not on file  Intimate Partner Violence: Not on file   Family Status  Relation Name Status  . Mother  Alive  . Father  Deceased  . Sister  Alive  . Brother  Alive  . MGM  (Not Specified)  . MGF  Alive  . Sister  Alive  . Neg Hx  (Not Specified)    Family History  Problem Relation Age of Onset  . Healthy Mother   . Cancer Father        unknown type  . Healthy Sister   . Healthy Brother   . Liver cancer Maternal Grandmother   . Pancreatic cancer Maternal Grandmother   . Diabetes Maternal Grandfather   . Healthy Sister   . Colon cancer Neg Hx   . Prostate cancer Neg Hx    No Known Allergies  Patient Care Team: Erasmo Downer, MD as PCP - General (Family Medicine)   Medications: Outpatient Medications Prior to Visit  Medication Sig  . vitamin B-12 (CYANOCOBALAMIN) 1000 MCG tablet Take 1 tablet (1,000 mcg total) by mouth daily.  . [DISCONTINUED] Multiple Vitamin (MULTIVITAMIN) tablet Take 1 tablet by mouth daily. (Patient not taking: Reported on 03/17/2021)   No facility-administered medications prior to visit.    Review of Systems  All other systems reviewed and are negative.     Objective    BP 118/74   Pulse 72   Temp 98.6 F (37 C)   Resp 16   Ht 5\' 10"  (1.778 m)   Wt 173 lb (78.5 kg)   BMI 24.82  kg/m    Physical Exam Vitals reviewed.  Constitutional:      General: He is not in acute distress.    Appearance: Normal appearance. He is well-developed. He is not diaphoretic.  HENT:     Head: Normocephalic and atraumatic.     Right Ear: Tympanic membrane, ear canal and external ear normal.     Left Ear: Tympanic membrane, ear canal and external ear normal.     Nose: Nose normal.     Mouth/Throat:     Mouth: Mucous membranes are moist.     Pharynx: Oropharynx is clear. No oropharyngeal exudate.  Eyes:     General: No scleral icterus.    Conjunctiva/sclera: Conjunctivae normal.     Pupils: Pupils are equal, round, and reactive to light.  Neck:     Thyroid: No thyromegaly.  Cardiovascular:     Rate and Rhythm: Normal rate and regular rhythm.     Pulses: Normal pulses.     Heart sounds: Normal heart sounds. No murmur heard.   Pulmonary:     Effort: Pulmonary effort is normal. No respiratory  distress.     Breath sounds: Normal breath sounds. No wheezing or rales.  Abdominal:     General: There is no distension.     Palpations: Abdomen is soft.     Tenderness: There is no abdominal tenderness.  Musculoskeletal:        General: No deformity.     Cervical back: Neck supple.     Right lower leg: No edema.     Left lower leg: No edema.  Lymphadenopathy:     Cervical: No cervical adenopathy.  Skin:    General: Skin is warm and dry.     Findings: No rash.  Neurological:     Mental Status: He is alert and oriented to person, place, and time. Mental status is at baseline.     Sensory: No sensory deficit.     Motor: No weakness.     Gait: Gait normal.  Psychiatric:        Mood and Affect: Mood normal.        Behavior: Behavior normal.        Thought Content: Thought content normal.       Last depression screening scores PHQ 2/9 Scores 03/17/2021 03/09/2020 02/25/2019  PHQ - 2 Score 0 0 0  PHQ- 9 Score 0 1 2   Last fall risk screening Fall Risk  03/17/2021  Falls in the past year? 0  Number falls in past yr: 0  Injury with Fall? 0  Risk for fall due to : -  Follow up Falls evaluation completed   Last Audit-C alcohol use screening Alcohol Use Disorder Test (AUDIT) 03/17/2021  1. How often do you have a drink containing alcohol? 1  2. How many drinks containing alcohol do you have on a typical day when you are drinking? 0  3. How often do you have six or more drinks on one occasion? 0  AUDIT-C Score 1  Alcohol Brief Interventions/Follow-up AUDIT Score <7 follow-up not indicated   A score of 3 or more in women, and 4 or more in men indicates increased risk for alcohol abuse, EXCEPT if all of the points are from question 1   No results found for any visits on 03/17/21.  Assessment & Plan    Routine Health Maintenance and Physical Exam  Exercise Activities and Dietary recommendations Goals   None     Immunization History  Administered Date(s) Administered  . Td  09/26/2014  . Tdap 09/26/2014    Health Maintenance  Topic Date Due  . INFLUENZA VACCINE  03/24/2021 (Originally 07/25/2020)  . COVID-19 Vaccine (1) 09/13/2021 (Originally 01/18/1992)  . TETANUS/TDAP  09/26/2024  . Hepatitis C Screening  Completed  . HIV Screening  Completed  . HPV VACCINES  Aged Out    Discussed health benefits of physical activity, and encouraged him to engage in regular exercise appropriate for his age and condition.    Return in about 1 year (around 03/17/2022) for CPE.     I, Shirlee Latch, MD, have reviewed all documentation for this visit. The documentation on 03/17/21 for the exam, diagnosis, procedures, and orders are all accurate and complete.   Bacigalupo, Marzella Schlein, MD, MPH [facility]

## 2021-08-01 ENCOUNTER — Telehealth: Payer: Self-pay | Admitting: Oncology

## 2021-08-01 NOTE — Telephone Encounter (Signed)
Called PT to advise that appt for 08/12 had to be moved to 08/15 at 2:30 PM because of Dr. Cathie Hoops being out of the office. Advised PT to call back.

## 2021-08-03 ENCOUNTER — Inpatient Hospital Stay: Payer: BC Managed Care – PPO

## 2021-08-03 ENCOUNTER — Telehealth: Payer: Self-pay

## 2021-08-03 NOTE — Telephone Encounter (Signed)
Per [name], pt called triage requesting that his lab appt on 8/10 be cancelled and that he will be getting his labwork at [facility] in [place]. Lab orders faxed to [facility].

## 2021-08-05 ENCOUNTER — Inpatient Hospital Stay: Payer: BC Managed Care – PPO | Admitting: Oncology

## 2021-08-08 ENCOUNTER — Inpatient Hospital Stay: Payer: BC Managed Care – PPO | Admitting: Oncology

## 2021-08-09 DIAGNOSIS — D709 Neutropenia, unspecified: Secondary | ICD-10-CM | POA: Diagnosis not present

## 2021-08-09 DIAGNOSIS — E538 Deficiency of other specified B group vitamins: Secondary | ICD-10-CM | POA: Diagnosis not present

## 2021-08-11 ENCOUNTER — Other Ambulatory Visit: Payer: Self-pay

## 2021-08-11 ENCOUNTER — Encounter: Payer: Self-pay | Admitting: Oncology

## 2021-08-11 ENCOUNTER — Inpatient Hospital Stay: Payer: BC Managed Care – PPO | Attending: Oncology | Admitting: Oncology

## 2021-08-11 DIAGNOSIS — D709 Neutropenia, unspecified: Secondary | ICD-10-CM | POA: Diagnosis not present

## 2021-08-11 NOTE — Progress Notes (Addendum)
Hematology/Oncology Consult note [facility] Telephone:([phone] [phone]   Patient Care Team: Erasmo Downer, MD as PCP - General (Family Medicine)  I connected with Sean Butler on 08/11/21 at  2:00 PM EDT by video enabled telemedicine visit and verified that I am speaking with the correct person using two identifiers.   I discussed the limitations, risks, security and privacy concerns of performing an evaluation and management service by telemedicine and the availability of in-person appointments. I also discussed with the patient that there may be a patient responsible charge related to this service. The patient expressed understanding and agreed to proceed.   Other persons participating in the visit and their role in the encounter: NP, Patient    Patient's location: Home   Provider's location: Clinic    REFERRING PROVIDER: Bacigalupo, Marzella Schlein, MD  CHIEF COMPLAINTS/REASON FOR VISIT:  Evaluation of Neutropenia  HISTORY OF PRESENTING ILLNESS:  Patient is a 35 y.o. male who was seen in consultation at the request of Bacigalupo, Marzella Schlein, MD for evaluation of neutropenia.  Reviewed patient's recent labs.  03/09/2020 Patient has low total WBC count was 2.6  predominantly neutropenia, 0.7 Previous lab records reviewed. Leukopenia duration is acute onset.  No aggravating or improving factors.  Associated symptoms:  denies fatigue, weight loss, fever, chills, frequent infection.  History hepatitis or HIV infection: Denies History of chronic liver disease: Denies History of blood transfusion: Denies Alcohol consumption: Denies Diet Vegetarian or Vegan: Denies Herbal medication: Denies    INTERVAL HISTORY Patient was last seen in clinic on 02/09/2021 by Dr. Cathie Hoops.  In the interim, he denies any changes.  Reports feeling well.  Denies any recent infections or concerns.  Review of Systems  Constitutional: Negative.  Negative for appetite change,  chills, fatigue and fever.  HENT:  Negative.  Negative for hearing loss, lump/mass, mouth sores and nosebleeds.   Eyes: Negative.  Negative for eye problems.  Respiratory:  Negative for cough, hemoptysis and shortness of breath.   Cardiovascular: Negative.  Negative for chest pain and leg swelling.  Gastrointestinal: Negative.  Negative for abdominal pain, blood in stool, constipation, diarrhea, nausea and vomiting.  Endocrine: Negative.  Negative for hot flashes.  Genitourinary: Negative.  Negative for bladder incontinence, difficulty urinating, dysuria, frequency and hematuria.   Musculoskeletal: Negative.  Negative for back pain, flank pain, gait problem and myalgias.  Skin: Negative.  Negative for itching and rash.  Neurological: Negative.  Negative for dizziness, gait problem, headaches, light-headedness and numbness.  Hematological: Negative.  Negative for adenopathy.  Psychiatric/Behavioral:  Negative for confusion. The patient is not nervous/anxious.    MEDICAL HISTORY:  Past Medical History:  Diagnosis Date   History of colon polyps    Overweight     SURGICAL HISTORY: Past Surgical History:  Procedure Laterality Date   NO PAST SURGERIES      SOCIAL HISTORY: Social History   Socioeconomic History   Marital status: Married    Spouse name: Pollie Meyer   Number of children: 2   Years of education: Not on file   Highest education level: Some college, no degree  Occupational History   Occupation: Realtor  Tobacco Use   Smoking status: Former    Types: Cigarettes    Quit date: 03/19/2016    Years since quitting: 5.4   Smokeless tobacco: Never   Tobacco comments:    smoked about once a month   Vaping Use   Vaping Use: Never used  Substance and  Sexual Activity   Alcohol use: Yes    Comment: 1 beer every 6 months   Drug use: No   Sexual activity: Yes    Partners: Female    Birth control/protection: None  Other Topics Concern   Not on file  Social History  Narrative   Not on file   Social Determinants of Health   Financial Resource Strain: Not on file  Food Insecurity: Not on file  Transportation Needs: Not on file  Physical Activity: Not on file  Stress: Not on file  Social Connections: Not on file  Intimate Partner Violence: Not on file    FAMILY HISTORY: Family History  Problem Relation Age of Onset   Healthy Mother    Cancer Father        unknown type   Healthy Sister    Healthy Brother    Liver cancer Maternal Grandmother    Pancreatic cancer Maternal Grandmother    Diabetes Maternal Grandfather    Healthy Sister    Colon cancer Neg Hx    Prostate cancer Neg Hx     ALLERGIES:  has No Known Allergies.  MEDICATIONS:  Current Outpatient Medications  Medication Sig Dispense Refill   vitamin B-12 (CYANOCOBALAMIN) 1000 MCG tablet Take 1 tablet (1,000 mcg total) by mouth daily. 30 tablet 5   No current facility-administered medications for this visit.     PHYSICAL EXAMINATION: ECOG PERFORMANCE STATUS: 0 - Asymptomatic There were no vitals filed for this visit.  There were no vitals filed for this visit.   Physical Exam Constitutional:      Appearance: Normal appearance.  Pulmonary:     Effort: Pulmonary effort is normal.  Neurological:     Mental Status: He is alert and oriented to person, place, and time. Mental status is at baseline.    CMP Latest Ref Rng & Units 02/07/2021  Glucose 70 - 99 mg/dL 88  BUN 6 - 20 mg/dL 24(M)  Creatinine 0.10 - 1.24 mg/dL 2.72  Sodium 536 - 644 mmol/L 139  Potassium 3.5 - 5.1 mmol/L 4.0  Chloride 98 - 111 mmol/L 102  CO2 22 - 32 mmol/L 27  Calcium 8.9 - 10.3 mg/dL 9.0  Total Protein 6.5 - 8.1 g/dL 7.5  Total Bilirubin 0.3 - 1.2 mg/dL 0.7  Alkaline Phos 38 - 126 U/L 46  AST 15 - 41 U/L 15  ALT 0 - 44 U/L 14   CBC Latest Ref Rng & Units 02/07/2021  WBC 4.0 - 10.5 K/uL 3.2(L)  Hemoglobin 13.0 - 17.0 g/dL 03.4  Hematocrit 74.2 - 52.0 % 40.1  Platelets 150 - 400 K/uL  214    LABORATORY DATA:  I have reviewed the data as listed Lab Results  Component Value Date   WBC 3.2 (L) 02/07/2021   HGB 13.7 02/07/2021   HCT 40.1 02/07/2021   MCV 87.2 02/07/2021   PLT 214 02/07/2021   Recent Labs    02/07/21 1109  NA 139  K 4.0  CL 102  CO2 27  GLUCOSE 88  BUN 22*  CREATININE 0.80  CALCIUM 9.0  GFRNONAA >60  PROT 7.5  ALBUMIN 4.2  AST 15  ALT 14  ALKPHOS 46  BILITOT 0.7   Iron/TIBC/Ferritin/ %Sat No results found for: IRON, TIBC, FERRITIN, IRONPCTSAT   RADIOGRAPHIC STUDIES: I have personally reviewed the radiological images as listed and agreed with the findings in the report. No results found.    ASSESSMENT & PLAN:  1. Neutropenia, unspecified type (HCC)  Neutropenia- Unclear etiology.  He has had extensive work-up and results are unremarkable.  Work-up included a negative ANA, negative peripheral flow cytometry, normal folate level, negative HIV and hepatitis.  Labs from [facility] showed improvement of his white blood cell count to 4.2 with an ANC of 1900.  Patient would like to be released back to his PCP given work-up was negative and labs are improving.  Dr. Cathie Hoops was agreeable at her last visit to release him as long as counts continued to be stable or improved.  I spent 15 minutes dedicated to the care of this patient (face-to-face and non-face-to-face) on the date of the encounter to include what is described in the assessment and plan.  No orders of the defined types were placed in this encounter.   All questions were answered. The patient knows to call the clinic with any problems questions or concerns.  Cc Bacigalupo, Marzella Schlein, MD    Durenda Hurt, NP 08/11/2021 3:04 PM

## 2021-11-18 DIAGNOSIS — Z87891 Personal history of nicotine dependence: Secondary | ICD-10-CM | POA: Insufficient documentation

## 2021-11-18 DIAGNOSIS — R55 Syncope and collapse: Secondary | ICD-10-CM | POA: Insufficient documentation

## 2021-11-19 ENCOUNTER — Other Ambulatory Visit: Payer: Self-pay

## 2021-11-19 ENCOUNTER — Emergency Department
Admission: EM | Admit: 2021-11-19 | Discharge: 2021-11-19 | Disposition: A | Discharge status: Home / Self Care | Payer: BC Managed Care – PPO | Attending: Emergency Medicine | Admitting: Emergency Medicine

## 2021-11-19 ENCOUNTER — Encounter: Payer: Self-pay | Admitting: Physician Assistant

## 2021-11-19 ENCOUNTER — Emergency Department: Payer: BC Managed Care – PPO

## 2021-11-19 DIAGNOSIS — R55 Syncope and collapse: Secondary | ICD-10-CM

## 2021-11-19 LAB — CBC
HCT: 37.6 % — ABNORMAL LOW (ref 39.0–52.0)
Hemoglobin: 12.9 g/dL — ABNORMAL LOW (ref 13.0–17.0)
MCH: 30.4 pg (ref 26.0–34.0)
MCHC: 34.3 g/dL (ref 30.0–36.0)
MCV: 88.5 fL (ref 80.0–100.0)
Platelets: 222 10*3/uL (ref 150–400)
RBC: 4.25 MIL/uL (ref 4.22–5.81)
RDW: 12.1 % (ref 11.5–15.5)
WBC: 4.2 10*3/uL (ref 4.0–10.5)
nRBC: 0 % (ref 0.0–0.2)

## 2021-11-19 LAB — URINALYSIS, ROUTINE W REFLEX MICROSCOPIC
Bacteria, UA: NONE SEEN
Bilirubin Urine: NEGATIVE
Glucose, UA: NEGATIVE mg/dL
Ketones, ur: NEGATIVE mg/dL
Leukocytes,Ua: NEGATIVE
Nitrite: NEGATIVE
Protein, ur: NEGATIVE mg/dL
Specific Gravity, Urine: >1.03 — ABNORMAL HIGH (ref 1.005–1.030)
pH: 6 (ref 5.0–8.0)

## 2021-11-19 LAB — BASIC METABOLIC PANEL
Anion gap: 6 (ref 5–15)
BUN: 19 mg/dL (ref 6–20)
CO2: 25 mmol/L (ref 22–32)
Calcium: 8.8 mg/dL — ABNORMAL LOW (ref 8.9–10.3)
Chloride: 103 mmol/L (ref 98–111)
Creatinine, Ser: 0.75 mg/dL (ref 0.61–1.24)
GFR, Estimated: >60 mL/min (ref >60)
Glucose, Bld: 102 mg/dL — ABNORMAL HIGH (ref 70–99)
Potassium: 3.9 mmol/L (ref 3.5–5.1)
Sodium: 134 mmol/L — ABNORMAL LOW (ref 135–145)

## 2021-11-19 LAB — CBG MONITORING, ED: Glucose-Capillary: 113 mg/dL — ABNORMAL HIGH (ref 70–99)

## 2021-11-19 NOTE — ED Provider Notes (Signed)
[facility] Emergency Department Provider Note ____________________________________________  Time seen: 1117  I have reviewed the triage vital signs and the nursing notes.  HISTORY  Chief Complaint  Loss of Consciousness  HPI Sean Butler is a 35 y.o. male with noncontributory medical history, presents to the ED with reports of a single syncopal episode that occurred around 1030 last night.  Family reports the episode lasted approximately 30 seconds no more than a minute as the patient fell off the couch.  Prior to the episode he was sitting down drinking hot chocolate.  He is unsure whether or not he hit his head.  No other injuries reported at this time.  EMS was called and reported to the scene last night, patient was found to have a normal blood pressure, rhythm strip, and CBG.  Patient denies any fever, chills, sweats, chest pain, nausea, vomiting, or diarrhea.  Patient gives history of previous near syncopal episodes in the past.  Past Medical History:  Diagnosis Date   History of colon polyps    Overweight     Patient Active Problem List   Diagnosis Date Noted   History of colon polyps 03/09/2020    Past Surgical History:  Procedure Laterality Date   NO PAST SURGERIES      Prior to Admission medications   Medication Sig Start Date End Date Taking? Authorizing Provider  vitamin B-12 (CYANOCOBALAMIN) 1000 MCG tablet Take 1 tablet (1,000 mcg total) by mouth daily. 02/09/21   Rickard Patience, MD    Allergies Shellfish allergy  Family History  Problem Relation Age of Onset   Healthy Mother    Cancer Father        unknown type   Healthy Sister    Healthy Brother    Liver cancer Maternal Grandmother    Pancreatic cancer Maternal Grandmother    Diabetes Maternal Grandfather    Healthy Sister    Colon cancer Neg Hx    Prostate cancer Neg Hx     Social History Social History   Tobacco Use   Smoking status: Former    Types: Cigarettes    Quit date:  03/19/2016    Years since quitting: 5.6   Smokeless tobacco: Never   Tobacco comments:    smoked about once a month   Vaping Use   Vaping Use: Never used  Substance Use Topics   Alcohol use: Yes    Comment: 1 beer every 6 months   Drug use: No    Review of Systems  Constitutional: Negative for fever. Eyes: Negative for visual changes. ENT: Negative for sore throat. Cardiovascular: Negative for chest pain. Respiratory: Negative for shortness of breath. Gastrointestinal: Negative for abdominal pain, vomiting and diarrhea. Genitourinary: Negative for dysuria. Musculoskeletal: Negative for back pain. Skin: Negative for rash. Neurological: Negative for headaches, focal weakness or numbness.  Reports syncopal episode as above. ____________________________________________  PHYSICAL EXAM:  VITAL SIGNS: ED Triage Vitals  Enc Vitals Group     BP 11/19/21 0058 105/70     Pulse Rate 11/19/21 0058 78     Resp 11/19/21 0058 18     Temp 11/19/21 0058 98.3 F (36.8 C)     Temp Source 11/19/21 0058 Oral     SpO2 11/19/21 0058 98 %     Weight 11/19/21 0058 178 lb (80.7 kg)     Height 11/19/21 0058 5\' 11"  (1.803 m)     Head Circumference --      Peak Flow --  Pain Score 11/19/21 0104 10     Pain Loc --      Pain Edu? --      Excl. in GC? --     Constitutional: Alert and oriented. Well appearing and in no distress. GCS =15 Head: Normocephalic and atraumatic. Eyes: Conjunctivae are normal. PERRL. Normal extraocular movements Mouth/Throat: Mucous membranes are moist. Cardiovascular: Normal rate, regular rhythm. Normal distal pulses. Respiratory: Normal respiratory effort. No wheezes/rales/rhonchi. Gastrointestinal: Soft and nontender. No distention. Musculoskeletal: Nontender with normal range of motion in all extremities.  Neurologic: Cranial nerves II through XII grossly intact.  Normal gait without ataxia. Normal speech and language. No gross focal neurologic deficits are  appreciated. Skin:  Skin is warm, dry and intact. No rash noted. Psychiatric: Mood and affect are normal. Patient exhibits appropriate insight and judgment. ____________________________________________    {LABS (pertinent positives/negatives)  Labs Reviewed  BASIC METABOLIC PANEL - Abnormal; Notable for the following components:      Result Value   Sodium 134 (*)    Glucose, Bld 102 (*)    Calcium 8.8 (*)    All other components within normal limits  CBC - Abnormal; Notable for the following components:   Hemoglobin 12.9 (*)    HCT 37.6 (*)    All other components within normal limits  URINALYSIS, ROUTINE W REFLEX MICROSCOPIC - Abnormal; Notable for the following components:   Specific Gravity, Urine >1.030 (*)    Hgb urine dipstick MODERATE (*)    All other components within normal limits  CBG MONITORING, ED - Abnormal; Notable for the following components:   Glucose-Capillary 113 (*)    All other components within normal limits  ____________________________________________  {EKG  Vent. rate 72 BPM PR interval 168 ms QRS duration 92 ms QT/QTcB 380/416 ms P-R-T axes 47 84 46 ____________________________________________   RADIOLOGY Official radiology report(s): CT HEAD WO CONTRAST ( )  Result Date: 11/19/2021 CLINICAL DATA:  Initial evaluation for acute syncope. EXAM: CT HEAD WITHOUT CONTRAST TECHNIQUE: Contiguous axial images were obtained from the base of the skull through the vertex without intravenous contrast. COMPARISON:  None. FINDINGS: Brain: Cerebral volume within normal limits for patient age. No evidence for acute intracranial hemorrhage. No findings to suggest acute large vessel territory infarct. No mass lesion, midline shift, or mass effect. Ventricles are normal in size without evidence for hydrocephalus. No extra-axial fluid collection identified. Vascular: No hyperdense vessel identified. Skull: Scalp soft tissues demonstrate no acute abnormality. Calvarium  intact. Sinuses/Orbits: Globes and orbital soft tissues within normal limits. Visualized paranasal sinuses are clear. No mastoid effusion. IMPRESSION: Negative head CT. No acute intracranial abnormality identified. Electronically Signed   By: Rise Mu M.D.   On: 11/19/2021 01:29   ____________________________________________  PROCEDURES  Orthostatic VS for the past 24 hrs:  BP- Lying Pulse- Lying BP- Sitting Pulse- Sitting BP- Standing at 0 minutes Pulse- Standing at 0 minutes  11/19/21 1257 106/70 62 123/78 71 114/80 86    Procedures ____________________________________________   INITIAL IMPRESSION / ASSESSMENT AND PLAN / ED COURSE  As part of my medical decision making, I reviewed the following data within the electronic MEDICAL RECORD NUMBER Labs reviewed WNL, EKG interpreted NSR, Radiograph reviewed NAD, and Notes from prior ED visits   DDX: Differential diagnosis includes, but is not limited to, alcohol, illicit or prescription medications, or other toxic ingestion; intracranial pathology such as stroke or intracerebral hemorrhage; fever or infectious causes including sepsis; hypoxemia and/or hypercarbia; uremia; trauma; endocrine related disorders such as  diabetes, hypoglycemia, and thyroid-related diseases; hypertensive encephalopathy; etc.  Patient with no significant medical history, presents to the ED for single witnessed episode of syncope last night.  Patient presents from home COVID by his wife in no acute distress.  Patient is stable at this time within reassuring work-up.  Labs are without abnormality to electrolytes and no evidence of anemia or sepsis on CBC.  CT is negative for any acute intracranial process, and EKG did not indicate any abnormal or malignant arrhythmias.  Patient at this time will be discharged with instructions to follow-up with primary provider.  He is given information on referral to cardiology which he may follow-up if symptoms persist or  recur.  Sean Butler was evaluated in Emergency Department on 11/19/2021 for the symptoms described in the history of present illness. He was evaluated in the context of the global COVID-19 pandemic, which necessitated consideration that the patient might be at risk for infection with the SARS-CoV-2 virus that causes COVID-19. Institutional protocols and algorithms that pertain to the evaluation of patients at risk for COVID-19 are in a state of rapid change based on information released by regulatory bodies including the CDC and federal and state organizations. These policies and algorithms were followed during the patient's care in the ED. ____________________________________________  FINAL CLINICAL IMPRESSION(S) / ED DIAGNOSES  Final diagnoses:  Syncope and collapse      Karmen Stabs, Charlesetta Ivory, PA-C 11/19/21 1612    Merwyn Katos, MD 11/20/21 [phone]

## 2021-11-19 NOTE — ED Notes (Signed)
DC ppw provided. Pt denies any questions. Pt verbal consent for dc given. Pt off floor on foot

## 2021-11-19 NOTE — Discharge Instructions (Addendum)
Your exam, labs, and CT are normal and reassuring at this time.  No indication of any acute head injury, abnormal cardiac arrhythmia, or electrolyte abnormality.  You should follow-up with primary provider or consider evaluation by cardiology for ongoing or persistent symptoms.  Return to the ED if needed.

## 2021-11-19 NOTE — ED Triage Notes (Addendum)
Pt c/o syncopal episode that occurred around 10:30pm. Per family member, Pt was sitting down drinking hot chocolate when he passed out. Pt fell off of couch. Pt unsure if he hot his head. Pt was unconscious approximately for 30-60 seconds. Pt a&ox4.

## 2021-11-21 ENCOUNTER — Telehealth: Payer: Self-pay

## 2021-11-21 NOTE — Telephone Encounter (Signed)
From ER note from 11/19/21  Patient at this time will be discharged with instructions to follow-up with primary provider.  He is given information on referral to cardiology which he may follow-up if symptoms persist or recur.  Okay to put in order for referral as urgent or standard tune frame? KW

## 2021-11-21 NOTE — Telephone Encounter (Signed)
Copied from CRM [id number]. Topic: General - Other >> Nov 21, 2021 10:40 AM Jaquita Rector A wrote: Reason for CRM: Victorino Dike patients wife called in needing to schedule a hospital follow up for patient he was seen in the ER but first appointment coming up is on 01/16/22 please advise and call Victorino Dike at Ph# [phone]

## 2021-11-24 ENCOUNTER — Other Ambulatory Visit: Payer: Self-pay | Admitting: Family Medicine

## 2021-11-24 DIAGNOSIS — R55 Syncope and collapse: Secondary | ICD-10-CM

## 2021-11-29 ENCOUNTER — Other Ambulatory Visit: Payer: Self-pay

## 2021-11-29 ENCOUNTER — Encounter: Payer: Self-pay | Admitting: Family Medicine

## 2021-11-29 ENCOUNTER — Ambulatory Visit: Payer: BC Managed Care – PPO | Admitting: Family Medicine

## 2021-11-29 VITALS — BP 103/64 | HR 67 | Resp 16 | Wt 182.6 lb

## 2021-11-29 DIAGNOSIS — Z833 Family history of diabetes mellitus: Secondary | ICD-10-CM | POA: Diagnosis not present

## 2021-11-29 DIAGNOSIS — D518 Other vitamin B12 deficiency anemias: Secondary | ICD-10-CM | POA: Diagnosis not present

## 2021-11-29 DIAGNOSIS — Z09 Encounter for follow-up examination after completed treatment for conditions other than malignant neoplasm: Secondary | ICD-10-CM | POA: Diagnosis not present

## 2021-11-29 DIAGNOSIS — R55 Syncope and collapse: Secondary | ICD-10-CM | POA: Diagnosis not present

## 2021-11-29 DIAGNOSIS — D649 Anemia, unspecified: Secondary | ICD-10-CM | POA: Insufficient documentation

## 2021-11-29 HISTORY — DX: Syncope and collapse: R55

## 2021-11-29 NOTE — Assessment & Plan Note (Signed)
Screening a1c 

## 2021-11-29 NOTE — Assessment & Plan Note (Signed)
Unexplained, was sitting on couch, drinking hot chocolate Likely dehydration/vaso-vagal Increase fluids

## 2021-11-29 NOTE — Progress Notes (Signed)
Established patient visit   Patient: Sean Butler   DOB: [birth year]   35 y.o. Male  MRN: [id number] Visit Date: 11/29/2021  Today's healthcare provider: Jacky Kindle, FNP   Chief Complaint  Patient presents with   ER Follow Up   Subjective    HPI  Follow up ER visit  Patient was seen in ER for loss of consciousness on 11/19/21. He was treated for syncope and collapse. He reports excellent compliance with treatment. He reports this condition is Improved.  -----------------------------------------------------------------------------------------   Medications: Outpatient Medications Prior to Visit  Medication Sig   vitamin B-12 (CYANOCOBALAMIN) 1000 MCG tablet Take 1 tablet (1,000 mcg total) by mouth daily.   No facility-administered medications prior to visit.    Review of Systems     Objective    BP 103/64   Pulse 67   Resp 16   Wt 182 lb 9.6 oz (82.8 kg)   SpO2 99%   BMI 25.47 kg/m    Physical Exam Vitals and nursing note reviewed.  Constitutional:      Appearance: Normal appearance. He is normal weight.  HENT:     Head: Normocephalic and atraumatic.  Eyes:     Pupils: Pupils are equal, round, and reactive to light.  Cardiovascular:     Rate and Rhythm: Normal rate and regular rhythm.     Pulses: Normal pulses.     Heart sounds: Normal heart sounds.  Pulmonary:     Effort: Pulmonary effort is normal.     Breath sounds: Normal breath sounds.  Musculoskeletal:        General: Normal range of motion.     Cervical back: Normal range of motion.  Skin:    General: Skin is warm and dry.     Capillary Refill: Capillary refill takes less than 2 seconds.  Neurological:     General: No focal deficit present.     Mental Status: He is alert and oriented to person, place, and time. Mental status is at baseline.  Psychiatric:        Mood and Affect: Mood normal.        Behavior: Behavior normal.        Thought Content: Thought content normal.         Judgment: Judgment normal.     No results found for any visits on 11/29/21.  Assessment & Plan     Problem List Items Addressed This Visit       Other   Encounter for examination following treatment at hospital - Primary    Seen for syncope and collapse Repeat labs today EKG normal Referral to cardiology and neurology  Refused application of in office Zio patch d/t cost      Relevant Orders   Ambulatory referral to Cardiology   Ambulatory referral to Neurology   CBC with Differential/Platelet   Comprehensive metabolic panel   Hemoglobin A1c   Urinalysis, Routine w reflex microscopic   Syncope and collapse    Unexplained, was sitting on couch, drinking hot chocolate Likely dehydration/vaso-vagal Increase fluids      Relevant Orders   CBC with Differential/Platelet   Comprehensive metabolic panel   Urinalysis, Routine w reflex microscopic   Family history of diabetes mellitus    Screening a1c      Relevant Orders   Hemoglobin A1c   RESOLVED: Absolute anemia   Relevant Orders   CBC with Differential/Platelet     Return if symptoms worsen or fail  to improve.      Leilani Merl, FNP, have reviewed all documentation for this visit. The documentation on 11/29/21 for the exam, diagnosis, procedures, and orders are all accurate and complete.    Jacky Kindle, FNP  [facility] [phone] (phone) [phone] (fax)  [facility]

## 2021-11-29 NOTE — Assessment & Plan Note (Signed)
Seen for syncope and collapse Repeat labs today EKG normal Referral to cardiology and neurology  Refused application of in office Zio patch d/t cost

## 2021-11-30 LAB — COMPREHENSIVE METABOLIC PANEL
ALT: 35 IU/L (ref 0–44)
AST: 35 IU/L (ref 0–40)
Albumin/Globulin Ratio: 2.1 (ref 1.2–2.2)
Albumin: 5 g/dL (ref 4.0–5.0)
Alkaline Phosphatase: 56 IU/L (ref 44–121)
BUN/Creatinine Ratio: 16 (ref 9–20)
BUN: 15 mg/dL (ref 6–20)
Bilirubin Total: 0.5 mg/dL (ref 0.0–1.2)
CO2: 26 mmol/L (ref 20–29)
Calcium: 9.7 mg/dL (ref 8.7–10.2)
Chloride: 102 mmol/L (ref 96–106)
Creatinine, Ser: 0.92 mg/dL (ref 0.76–1.27)
Globulin, Total: 2.4 g/dL (ref 1.5–4.5)
Glucose: 78 mg/dL (ref 70–99)
Potassium: 5 mmol/L (ref 3.5–5.2)
Sodium: 139 mmol/L (ref 134–144)
Total Protein: 7.4 g/dL (ref 6.0–8.5)
eGFR: 112 mL/min/{1.73_m2} (ref >59)

## 2021-11-30 LAB — CBC WITH DIFFERENTIAL/PLATELET
Basophils Absolute: 0 10*3/uL (ref 0.0–0.2)
Basos: 1 %
EOS (ABSOLUTE): 0.2 10*3/uL (ref 0.0–0.4)
Eos: 5 %
Hematocrit: 40.9 % (ref 37.5–51.0)
Hemoglobin: 13.9 g/dL (ref 13.0–17.7)
Immature Grans (Abs): 0 10*3/uL (ref 0.0–0.1)
Immature Granulocytes: 0 %
Lymphocytes Absolute: 1.8 10*3/uL (ref 0.7–3.1)
Lymphs: 48 %
MCH: 29.8 pg (ref 26.6–33.0)
MCHC: 34 g/dL (ref 31.5–35.7)
MCV: 88 fL (ref 79–97)
Monocytes Absolute: 0.4 10*3/uL (ref 0.1–0.9)
Monocytes: 11 %
Neutrophils Absolute: 1.3 10*3/uL — ABNORMAL LOW (ref 1.4–7.0)
Neutrophils: 35 %
Platelets: 244 10*3/uL (ref 150–450)
RBC: 4.67 x10E6/uL (ref 4.14–5.80)
RDW: 12.1 % (ref 11.6–15.4)
WBC: 3.7 10*3/uL (ref 3.4–10.8)

## 2021-11-30 LAB — URINALYSIS, ROUTINE W REFLEX MICROSCOPIC
Bilirubin, UA: NEGATIVE
Glucose, UA: NEGATIVE
Ketones, UA: NEGATIVE
Leukocytes,UA: NEGATIVE
Nitrite, UA: NEGATIVE
Protein,UA: NEGATIVE
Specific Gravity, UA: 1.016 (ref 1.005–1.030)
Urobilinogen, Ur: 0.2 mg/dL (ref 0.2–1.0)
pH, UA: 8.5 — ABNORMAL HIGH (ref 5.0–7.5)

## 2021-11-30 LAB — MICROSCOPIC EXAMINATION
Bacteria, UA: NONE SEEN
Casts: NONE SEEN /lpf
Epithelial Cells (non renal): NONE SEEN /hpf (ref 0–10)
WBC, UA: NONE SEEN /hpf (ref 0–5)

## 2021-11-30 LAB — HEMOGLOBIN A1C
Est. average glucose Bld gHb Est-mCnc: 111 mg/dL
Hgb A1c MFr Bld: 5.5 % (ref 4.8–5.6)

## 2021-12-05 ENCOUNTER — Other Ambulatory Visit: Payer: Self-pay | Admitting: Oncology

## 2021-12-14 DIAGNOSIS — R55 Syncope and collapse: Secondary | ICD-10-CM | POA: Diagnosis not present

## 2022-03-21 ENCOUNTER — Encounter: Payer: BC Managed Care – PPO | Admitting: Family Medicine

## 2022-03-21 NOTE — Progress Notes (Deleted)
? ? ? ?Complete physical exam ? ? ?Patient: Sean Butler   DOB: 1986/04/09   36 y.o. Male  MRN: [id number] ?Visit Date: 03/24/2022 ? ?Today's healthcare provider: Shirlee Latch, MD  ? ?No chief complaint on file. ? ?Subjective  ?  ?Sean Butler is a 36 y.o. male who presents today for a complete physical exam.  ?He reports consuming a {diet types:17450} diet. {Exercise:19826} He generally feels {well/fairly well/poorly:18703}. He reports sleeping {well/fairly well/poorly:18703}. He {does/does not:200015} have additional problems to discuss today.  ?HPI  ?*** ? ?Past Medical History:  ?Diagnosis Date  ? History of colon polyps   ? Overweight   ? ?Past Surgical History:  ?Procedure Laterality Date  ? NO PAST SURGERIES    ? ?Social History  ? ?Socioeconomic History  ? Marital status: Married  ?  Spouse name: Pollie Meyer  ? Number of children: 2  ? Years of education: Not on file  ? Highest education level: Some college, no degree  ?Occupational History  ? Occupation: Realtor  ?Tobacco Use  ? Smoking status: Former  ?  Types: Cigarettes  ?  Quit date: 03/19/2016  ?  Years since quitting: 6.0  ? Smokeless tobacco: Never  ? Tobacco comments:  ?  smoked about once a month   ?Vaping Use  ? Vaping Use: Never used  ?Substance and Sexual Activity  ? Alcohol use: Yes  ?  Comment: 1 beer every 6 months  ? Drug use: No  ? Sexual activity: Yes  ?  Partners: Female  ?  Birth control/protection: None  ?Other Topics Concern  ? Not on file  ?Social History Narrative  ? Not on file  ? ?Social Determinants of Health  ? ?Financial Resource Strain: Not on file  ?Food Insecurity: Not on file  ?Transportation Needs: Not on file  ?Physical Activity: Not on file  ?Stress: Not on file  ?Social Connections: Not on file  ?Intimate Partner Violence: Not on file  ? ?Family Status  ?Relation Name Status  ? Mother  Alive  ? Father  Deceased  ? Sister  Alive  ? Brother  Alive  ? MGM  (Not Specified)  ? MGF  Alive  ? Sister  Alive  ? Neg Hx  (Not  Specified)  ? ?Family History  ?Problem Relation Age of Onset  ? Healthy Mother   ? Cancer Father   ?     unknown type  ? Healthy Sister   ? Healthy Brother   ? Liver cancer Maternal Grandmother   ? Pancreatic cancer Maternal Grandmother   ? Diabetes Maternal Grandfather   ? Healthy Sister   ? Colon cancer Neg Hx   ? Prostate cancer Neg Hx   ? ?Allergies  ?Allergen Reactions  ? Shellfish Allergy Anaphylaxis  ?  ?Patient Care Team: ?Erasmo Downer, MD as PCP - General (Family Medicine)  ? ?Medications: ?Outpatient Medications Prior to Visit  ?Medication Sig  ? vitamin B-12 (CYANOCOBALAMIN) 1000 MCG tablet Take 1 tablet (1,000 mcg total) by mouth daily.  ? ?No facility-administered medications prior to visit.  ? ? ?Review of Systems ? ?{Labs  Heme  Chem  Endocrine  Serology  Results Review (optional):23779} ? Objective  ?  ?There were no vitals taken for this visit. ?{Show previous vital signs (optional):23777} ? ? ?Physical Exam  ?*** ? ?Last depression screening scores ? ?  03/17/2021  ?  2:13 PM 03/09/2020  ? 10:10 AM 02/25/2019  ? 10:06 AM  ?PHQ 2/9  Scores  ?PHQ - 2 Score 0 0 0  ?PHQ- 9 Score 0 1 2  ? ?Last fall risk screening ? ?  03/17/2021  ?  2:12 PM  ?Fall Risk   ?Falls in the past year? 0  ?Number falls in past yr: 0  ?Injury with Fall? 0  ?Follow up Falls evaluation completed  ? ?Last Audit-C alcohol use screening ? ?  03/17/2021  ?  2:12 PM  ?Alcohol Use Disorder Test (AUDIT)  ?1. How often do you have a drink containing alcohol? 1  ?2. How many drinks containing alcohol do you have on a typical day when you are drinking? 0  ?3. How often do you have six or more drinks on one occasion? 0  ?AUDIT-C Score 1  ?Alcohol Brief Interventions/Follow-up AUDIT Score <7 follow-up not indicated  ? ?A score of 3 or more in women, and 4 or more in men indicates increased risk for alcohol abuse, EXCEPT if all of the points are from question 1  ? ?No results found for any visits on 03/24/22. ? Assessment & Plan  ?   ?Routine Health Maintenance and Physical Exam ? ?Exercise Activities and Dietary recommendations ? Goals   ?None ?  ? ? ?Immunization History  ?Administered Date(s) Administered  ? Td 09/26/2014  ? Tdap 09/26/2014  ? ? ?Health Maintenance  ?Topic Date Due  ? COVID-19 Vaccine (1) Never done  ? INFLUENZA VACCINE  03/25/2022 (Originally 07/25/2021)  ? TETANUS/TDAP  09/26/2024  ? Hepatitis C Screening  Completed  ? HIV Screening  Completed  ? HPV VACCINES  Aged Out  ? ? ?Discussed health benefits of physical activity, and encouraged him to engage in regular exercise appropriate for his age and condition. ? ?*** ? ?No follow-ups on file.  ?  ? ?{provider attestation***:1} ? ? ?Shirlee Latch, MD  ?[facility] ?[phone] (phone) ?[phone] (fax) ? ?[facility]  ?

## 2022-03-23 ENCOUNTER — Telehealth: Payer: Self-pay

## 2022-03-23 NOTE — Telephone Encounter (Signed)
Copied from CRM [id number]. Topic: Appointment Scheduling - Scheduling Inquiry for Clinic >> Mar 23, 2022  9:24 AM [name] wrote: Reason for CRM: Pt had appt for a physical with Dr. Leonard Schwartz on 03/24/22 but asked to reschedule due to their babysitter cancelling. Attempted to reschedule the appt but there were no appts available for a physical with Dr. Leonard Schwartz. Pt requests return call to reschedule

## 2022-03-24 ENCOUNTER — Encounter: Payer: BC Managed Care – PPO | Admitting: Family Medicine

## 2022-05-05 NOTE — Progress Notes (Signed)
? ? ?I,Joseline E Rosas,acting as a scribe for Lavon Paganini, MD.,have documented all relevant documentation on the behalf of Lavon Paganini, MD,as directed by  Lavon Paganini, MD while in the presence of Lavon Paganini, MD.  ? ?Complete physical exam ? ? ?Patient: Sean Butler   DOB: 1986/05/13   36 y.o. Male  MRN: [id number] ?Visit Date: 05/08/2022 ? ?Today's healthcare provider: Lavon Paganini, MD  ? ?Chief Complaint  ?Patient presents with  ? Annual Exam  ? ?Subjective  ?  ?Jarnell Cordaro is a 36 y.o. male who presents today for a complete physical exam.  ?He reports consuming a general diet. Home exercise routine includes weights. Gym/ health club routine includes mod to heavy weightlifting. He generally feels well. He reports sleeping well. He does not have additional problems to discuss today.  ?HPI  ? ? ?Past Medical History:  ?Diagnosis Date  ? History of colon polyps   ? Overweight   ? Syncope and collapse 11/29/2021  ? ?Past Surgical History:  ?Procedure Laterality Date  ? NO PAST SURGERIES    ? ?Social History  ? ?Socioeconomic History  ? Marital status: Married  ?  Spouse name: Gerre Scull  ? Number of children: 2  ? Years of education: Not on file  ? Highest education level: Some college, no degree  ?Occupational History  ? Occupation: Realtor  ?Tobacco Use  ? Smoking status: Former  ?  Types: Cigarettes  ?  Quit date: 03/19/2016  ?  Years since quitting: 6.1  ? Smokeless tobacco: Never  ? Tobacco comments:  ?  smoked about once a month   ?Vaping Use  ? Vaping Use: Never used  ?Substance and Sexual Activity  ? Alcohol use: Yes  ?  Comment: 1 beer every 6 months  ? Drug use: No  ? Sexual activity: Yes  ?  Partners: Female  ?  Birth control/protection: None  ?Other Topics Concern  ? Not on file  ?Social History Narrative  ? Not on file  ? ?Social Determinants of Health  ? ?Financial Resource Strain: Not on file  ?Food Insecurity: Not on file  ?Transportation Needs: Not on file  ?Physical  Activity: Not on file  ?Stress: Not on file  ?Social Connections: Not on file  ?Intimate Partner Violence: Not on file  ? ?Family Status  ?Relation Name Status  ? Mother  Alive  ? Father  Deceased  ? Sister  Alive  ? Brother  Alive  ? MGM  (Not Specified)  ? MGF  Alive  ? Sister  Alive  ? Neg Hx  (Not Specified)  ? ?Family History  ?Problem Relation Age of Onset  ? Healthy Mother   ? Cancer Father   ?     unknown type  ? Healthy Sister   ? Healthy Brother   ? Liver cancer Maternal Grandmother   ? Pancreatic cancer Maternal Grandmother   ? Diabetes Maternal Grandfather   ? Healthy Sister   ? Colon cancer Neg Hx   ? Prostate cancer Neg Hx   ? ?Allergies  ?Allergen Reactions  ? Shellfish Allergy Anaphylaxis  ?  ?Patient Care Team: ?Virginia Crews, MD as PCP - General (Family Medicine)  ? ?Medications: ?Outpatient Medications Prior to Visit  ?Medication Sig  ? vitamin B-12 (CYANOCOBALAMIN) 1000 MCG tablet Take 1 tablet (1,000 mcg total) by mouth daily.  ? ?No facility-administered medications prior to visit.  ? ? ?Review of Systems  ?Constitutional: Negative.   ?HENT: Negative.    ?  Eyes: Negative.   ?Respiratory: Negative.    ?Cardiovascular: Negative.   ?Gastrointestinal: Negative.   ?Endocrine: Negative.   ?Genitourinary: Negative.   ?Musculoskeletal: Negative.   ?Skin: Negative.   ?Allergic/Immunologic: Negative.   ?Neurological: Negative.   ?Hematological: Negative.   ?Psychiatric/Behavioral: Negative.    ?All other systems reviewed and are negative. ? ?Last CBC ?Lab Results  ?Component Value Date  ? WBC 3.7 11/29/2021  ? HGB 13.9 11/29/2021  ? HCT 40.9 11/29/2021  ? MCV 88 11/29/2021  ? MCH 29.8 11/29/2021  ? RDW 12.1 11/29/2021  ? PLT 244 11/29/2021  ? ?Last metabolic panel ?Lab Results  ?Component Value Date  ? GLUCOSE 78 11/29/2021  ? NA 139 11/29/2021  ? K 5.0 11/29/2021  ? CL 102 11/29/2021  ? CO2 26 11/29/2021  ? BUN 15 11/29/2021  ? CREATININE 0.92 11/29/2021  ? EGFR 112 11/29/2021  ? CALCIUM 9.7  11/29/2021  ? PROT 7.4 11/29/2021  ? ALBUMIN 5.0 11/29/2021  ? LABGLOB 2.4 11/29/2021  ? AGRATIO 2.1 11/29/2021  ? BILITOT 0.5 11/29/2021  ? ALKPHOS 56 11/29/2021  ? AST 35 11/29/2021  ? ALT 35 11/29/2021  ? ANIONGAP 6 11/19/2021  ? ?Last lipids ?Lab Results  ?Component Value Date  ? CHOL 172 03/09/2020  ? HDL 69 03/09/2020  ? Farley 95 03/09/2020  ? TRIG 39 03/09/2020  ? CHOLHDL 2.5 03/09/2020  ? ?Last hemoglobin A1c ?Lab Results  ?Component Value Date  ? HGBA1C 5.5 11/29/2021  ? ?Last thyroid functions ?Lab Results  ?Component Value Date  ? TSH 0.712 03/22/2020  ? ?No results found for: 25OHVITD2, Evans, VD25OH ?Last vitamin B12 and Folate ?Lab Results  ?Component Value Date  ? [id number] 187 02/07/2021  ? FOLATE 13.8 03/22/2020  ? ?  ? Objective  ? ?  ?BP 104/62 (BP Location: Left Arm, Patient Position: Sitting, Cuff Size: Normal)   Pulse 68   Temp 98.5 ?F (36.9 ?C) (Oral)   Resp 16   Ht _0  (1.803 m)   Wt 183 lb 12.8 oz (83.4 kg)   BMI 25.63 kg/m?  ?BP Readings from Last 3 Encounters:  ?05/08/22 104/62  ?11/29/21 103/64  ?11/19/21 110/78  ? ?Wt Readings from Last 3 Encounters:  ?05/08/22 183 lb 12.8 oz (83.4 kg)  ?11/29/21 182 lb 9.6 oz (82.8 kg)  ?11/19/21 178 lb (80.7 kg)  ? ?  ? ?Physical Exam ?Vitals reviewed.  ?Constitutional:   ?   General: He is not in acute distress. ?   Appearance: Normal appearance. He is well-developed. He is not diaphoretic.  ?HENT:  ?   Head: Normocephalic and atraumatic.  ?   Right Ear: Tympanic membrane, ear canal and external ear normal.  ?   Left Ear: Tympanic membrane, ear canal and external ear normal.  ?   Nose: Nose normal.  ?   Mouth/Throat:  ?   Mouth: Mucous membranes are moist.  ?   Pharynx: Oropharynx is clear. No oropharyngeal exudate.  ?Eyes:  ?   General: No scleral icterus. ?   Conjunctiva/sclera: Conjunctivae normal.  ?   Pupils: Pupils are equal, round, and reactive to light.  ?Neck:  ?   Thyroid: No thyromegaly.  ?Cardiovascular:  ?   Rate and  Rhythm: Normal rate and regular rhythm.  ?   Pulses: Normal pulses.  ?   Heart sounds: Normal heart sounds. No murmur heard. ?Pulmonary:  ?   Effort: Pulmonary effort is normal. No respiratory distress.  ?  Breath sounds: Normal breath sounds. No wheezing or rales.  ?Abdominal:  ?   General: There is no distension.  ?   Palpations: Abdomen is soft.  ?   Tenderness: There is no abdominal tenderness.  ?Musculoskeletal:     ?   General: No deformity.  ?   Cervical back: Neck supple.  ?   Right lower leg: No edema.  ?   Left lower leg: No edema.  ?Lymphadenopathy:  ?   Cervical: No cervical adenopathy.  ?Skin: ?   General: Skin is warm and dry.  ?   Findings: No rash.  ?Neurological:  ?   Mental Status: He is alert and oriented to person, place, and time. Mental status is at baseline.  ?   Gait: Gait normal.  ?Psychiatric:     ?   Mood and Affect: Mood normal.     ?   Behavior: Behavior normal.     ?   Thought Content: Thought content normal.  ?  ? ?Last depression screening scores ? ?  05/08/2022  ?  2:26 PM 03/17/2021  ?  2:13 PM 03/09/2020  ? 10:10 AM  ?PHQ 2/9 Scores  ?PHQ - 2 Score 0 0 0  ?PHQ- 9 Score  0 1  ? ?Last fall risk screening ? ?  05/08/2022  ?  2:25 PM  ?Fall Risk   ?Falls in the past year? 0  ?Number falls in past yr: 0  ?Injury with Fall? 0  ? ?Last Audit-C alcohol use screening ? ?  05/08/2022  ?  2:26 PM  ?Alcohol Use Disorder Test (AUDIT)  ?1. How often do you have a drink containing alcohol? 0  ?2. How many drinks containing alcohol do you have on a typical day when you are drinking? 0  ?3. How often do you have six or more drinks on one occasion? 0  ?AUDIT-C Score 0  ? ?A score of 3 or more in women, and 4 or more in men indicates increased risk for alcohol abuse, EXCEPT if all of the points are from question 1  ? ?No results found for any visits on 05/08/22. ? Assessment & Plan  ?  ?Routine Health Maintenance and Physical Exam ? ?Exercise Activities and Dietary recommendations ? Goals   ?None ?   ? ? ?Immunization History  ?Administered Date(s) Administered  ? Td 09/26/2014  ? Tdap 09/26/2014  ? ? ?Health Maintenance  ?Topic Date Due  ? COVID-19 Vaccine (1) Never done  ? INFLUENZA VACCINE  07/25/2022  ? TETANUS/TDAP

## 2022-05-08 ENCOUNTER — Ambulatory Visit (INDEPENDENT_AMBULATORY_CARE_PROVIDER_SITE_OTHER): Payer: BC Managed Care – PPO | Admitting: Family Medicine

## 2022-05-08 ENCOUNTER — Encounter: Payer: Self-pay | Admitting: Family Medicine

## 2022-05-08 VITALS — BP 104/62 | HR 68 | Temp 98.5°F | Resp 16 | Ht 71.0 in | Wt 183.8 lb

## 2022-05-08 DIAGNOSIS — Z0000 Encounter for general adult medical examination without abnormal findings: Secondary | ICD-10-CM | POA: Diagnosis not present

## 2022-05-08 DIAGNOSIS — Z833 Family history of diabetes mellitus: Secondary | ICD-10-CM | POA: Diagnosis not present

## 2022-05-09 LAB — COMPREHENSIVE METABOLIC PANEL
ALT: 13 IU/L (ref 0–44)
AST: 14 IU/L (ref 0–40)
Albumin/Globulin Ratio: 1.8 (ref 1.2–2.2)
Albumin: 4.5 g/dL (ref 4.0–5.0)
Alkaline Phosphatase: 61 IU/L (ref 44–121)
BUN/Creatinine Ratio: 16 (ref 9–20)
BUN: 19 mg/dL (ref 6–20)
Bilirubin Total: 0.3 mg/dL (ref 0.0–1.2)
CO2: 26 mmol/L (ref 20–29)
Calcium: 8.9 mg/dL (ref 8.7–10.2)
Chloride: 104 mmol/L (ref 96–106)
Creatinine, Ser: 1.18 mg/dL (ref 0.76–1.27)
Globulin, Total: 2.5 g/dL (ref 1.5–4.5)
Glucose: 80 mg/dL (ref 70–99)
Potassium: 4.2 mmol/L (ref 3.5–5.2)
Sodium: 142 mmol/L (ref 134–144)
Total Protein: 7 g/dL (ref 6.0–8.5)
eGFR: 83 mL/min/{1.73_m2} (ref >59)

## 2022-05-09 LAB — HEMOGLOBIN A1C
Est. average glucose Bld gHb Est-mCnc: 108 mg/dL
Hgb A1c MFr Bld: 5.4 % (ref 4.8–5.6)

## 2022-05-09 LAB — LIPID PANEL WITH LDL/HDL RATIO
Cholesterol, Total: 174 mg/dL (ref 100–199)
HDL: 79 mg/dL (ref >39)
LDL Chol Calc (NIH): 86 mg/dL (ref 0–99)
LDL/HDL Ratio: 1.1 ratio (ref 0.0–3.6)
Triglycerides: 44 mg/dL (ref 0–149)
VLDL Cholesterol Cal: 9 mg/dL (ref 5–40)

## 2022-07-31 DIAGNOSIS — Z3009 Encounter for other general counseling and advice on contraception: Secondary | ICD-10-CM | POA: Diagnosis not present

## 2022-09-21 DIAGNOSIS — Z302 Encounter for sterilization: Secondary | ICD-10-CM | POA: Diagnosis not present

## 2022-11-26 IMAGING — CT CT HEAD W/O CM
4 series · 16 of 47 positions shown, 18 images · non-contrast
Comparison: None.

CLINICAL DATA: Initial evaluation for acute syncope.

EXAM:
CT HEAD WITHOUT CONTRAST
TECHNIQUE: Contiguous axial images were obtained from the base of the skull
through the vertex without intravenous contrast.

[Series 2: head wo · axial · 0.41mm/px · z∈[-30,+90]mm · 7 of 32 slices shown, 9 images]
[im 4/32  brain]
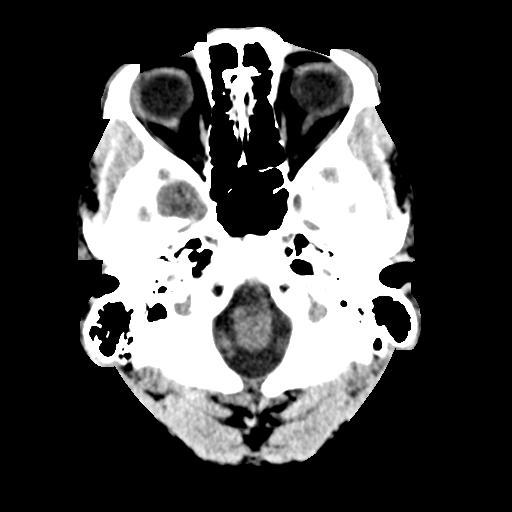
[im 4/32  bone]
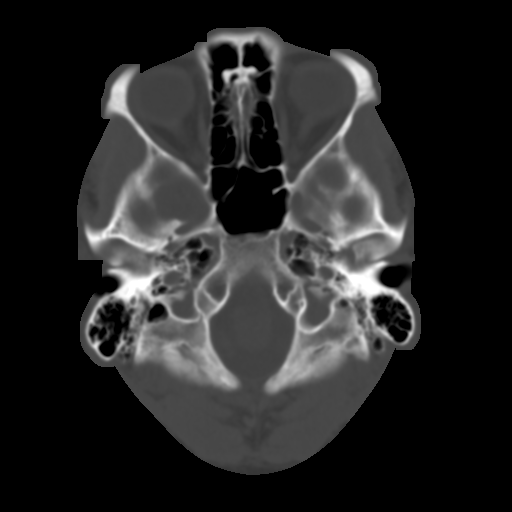
[im 8/32  brain]
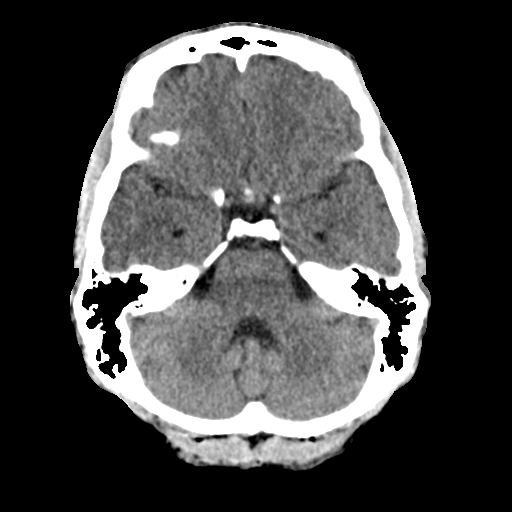
[im 12/32  brain]
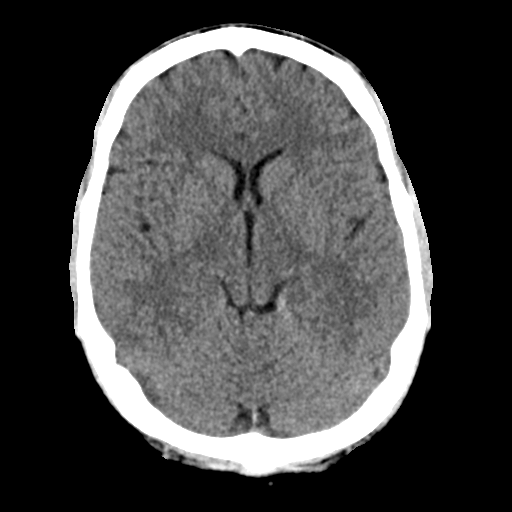
[im 16/32  brain]
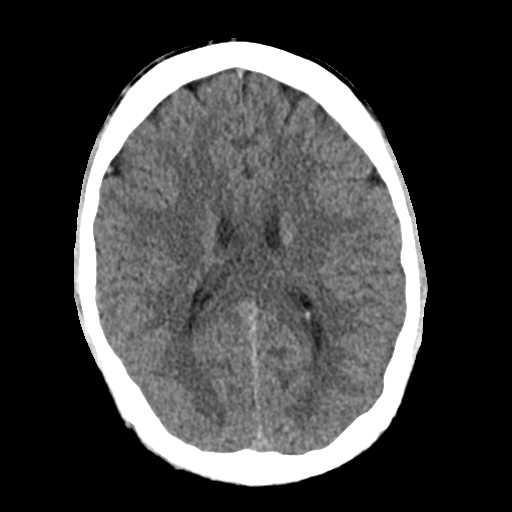
[im 20/32  brain]
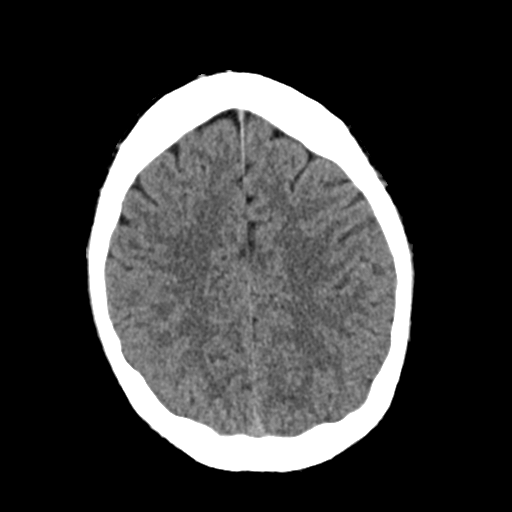
[im 20/32  bone]
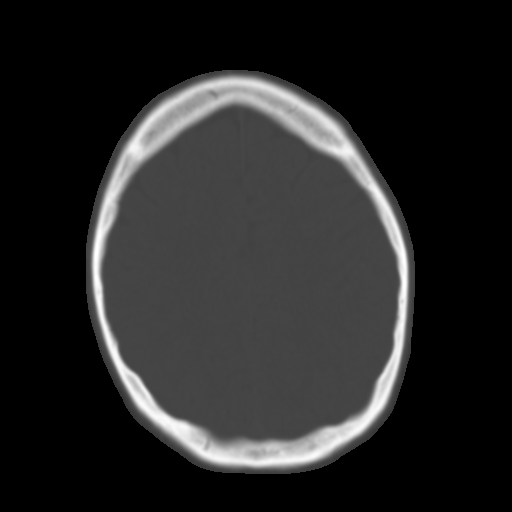
[im 24/32  brain]
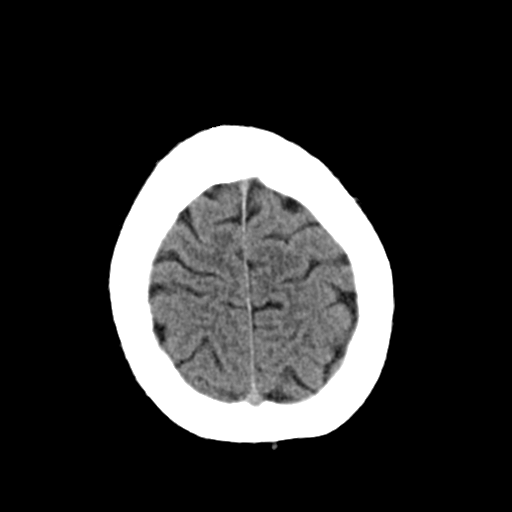
[im 28/32  brain]
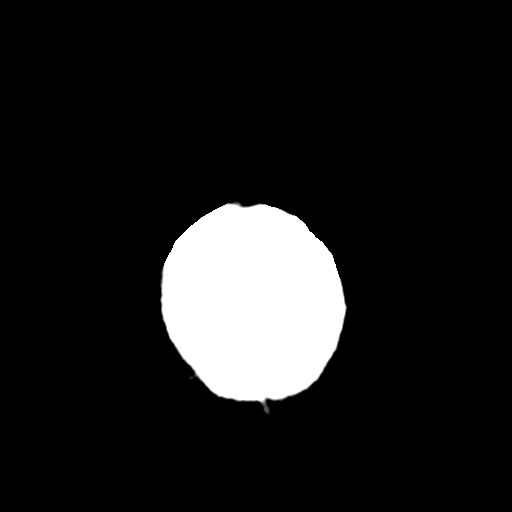

[Series 3: head bone · axial · 0.41mm/px · z∈[-31,+1]mm · 3 of 79 slices shown]
[im 8/79  bone]
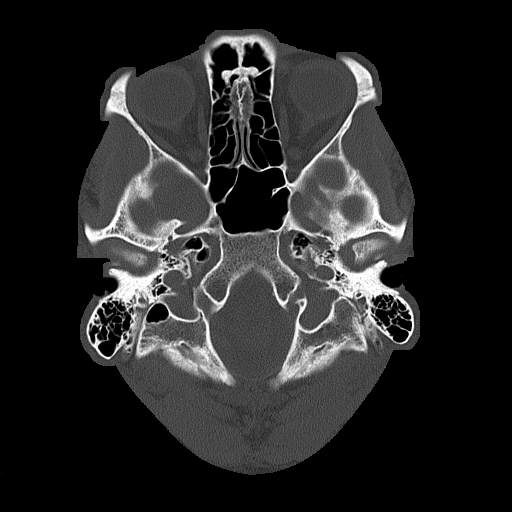
[im 16/79  bone]
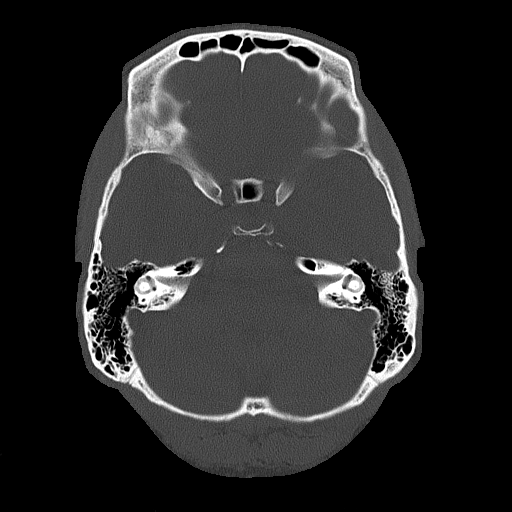
[im 24/79  bone]
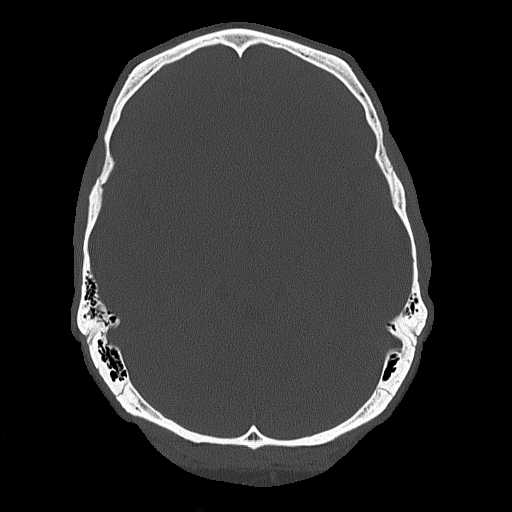

[Series 4: coronal soft tissue · coronal · 0.34mm/px · 3 of 73 slices shown]
[im 25/73  brain]
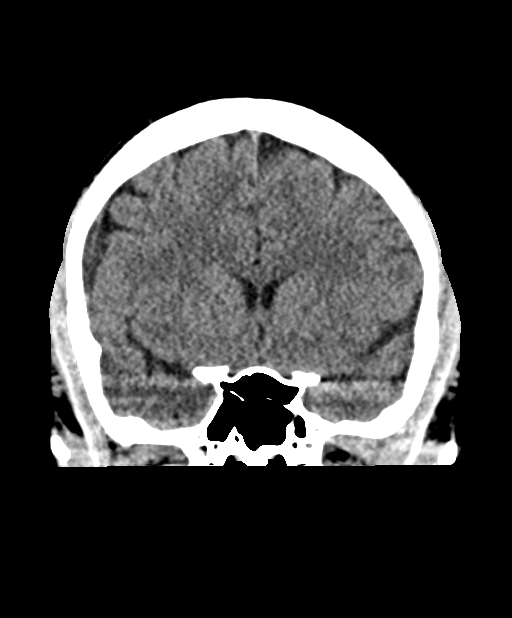
[im 33/73  brain]
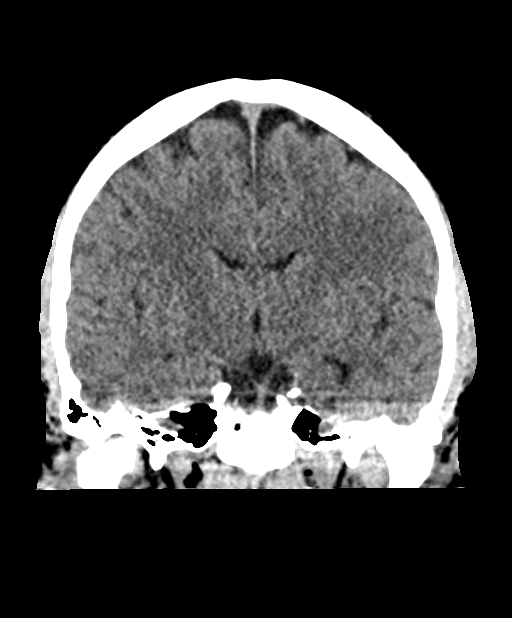
[im 41/73  brain]
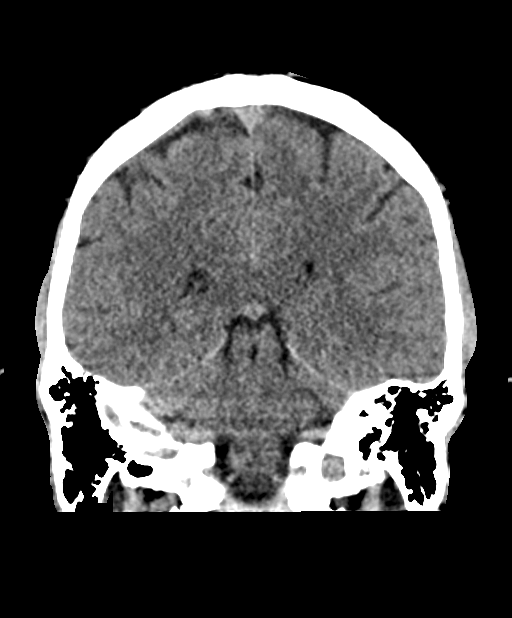

[Series 5: sagittal soft tissue · sagittal · 0.35mm/px · 3 of 60 slices shown]
[im 20/60  brain]
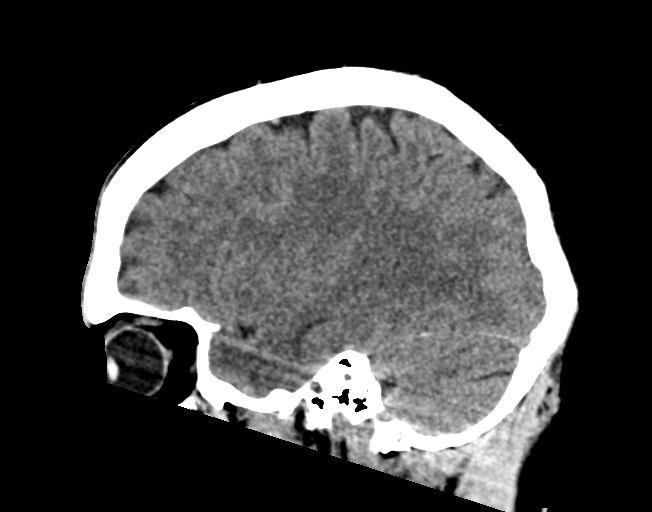
[im 30/60  brain]
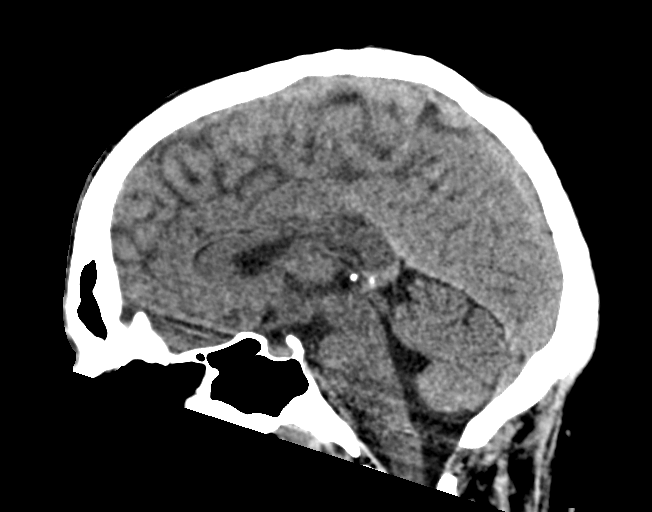
[im 40/60  brain]
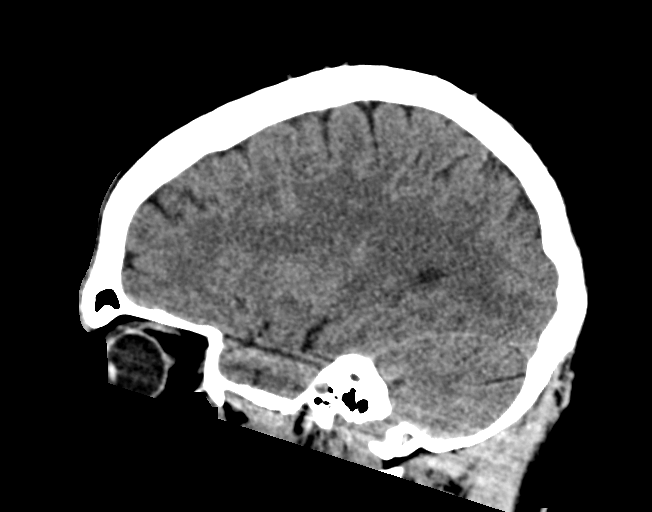

[16 of 47 positions shown; findings below may reference images not displayed]

FINDINGS: Brain: Cerebral volume within normal limits for patient age.

No evidence for acute intracranial hemorrhage. No findings to
suggest acute large vessel territory infarct. No mass lesion,
midline shift, or mass effect. Ventricles are normal in size without
evidence for hydrocephalus. No extra-axial fluid collection
identified.

Vascular: No hyperdense vessel identified.

Skull: Scalp soft tissues demonstrate no acute abnormality.
Calvarium intact.

Sinuses/Orbits: Globes and orbital soft tissues within normal
limits.

Visualized paranasal sinuses are clear. No mastoid effusion.
IMPRESSION: Negative head CT. No acute intracranial abnormality identified.

## 2023-05-09 NOTE — Progress Notes (Unsigned)
I,Abagail Limb S Anabela Crayton,acting as a Neurosurgeon for Shirlee Latch, MD.,have documented all relevant documentation on the behalf of Shirlee Latch, MD,as directed by  Shirlee Latch, MD while in the presence of Shirlee Latch, MD.    Complete physical exam   Patient: Sean Butler   DOB: [birth year]   37 y.o. Male  MRN: [id number] Visit Date: 05/10/2023  Today's healthcare provider: Shirlee Latch, MD   No chief complaint on file.  Subjective    Sean Butler is a 37 y.o. male who presents today for a complete physical exam.  He reports consuming a {diet types:17450} diet. {Exercise:19826} He generally feels {well/fairly well/poorly:18703}. He reports sleeping {well/fairly well/poorly:18703}. He {does/does not:200015} have additional problems to discuss today.  HPI    Past Medical History:  Diagnosis Date   History of colon polyps    Overweight    Syncope and collapse 11/29/2021   Past Surgical History:  Procedure Laterality Date   NO PAST SURGERIES     Social History   Socioeconomic History   Marital status: Married    Spouse name: Pollie Meyer   Number of children: 2   Years of education: Not on file   Highest education level: Some college, no degree  Occupational History   Occupation: Realtor  Tobacco Use   Smoking status: Former    Types: Cigarettes    Quit date: 03/19/2016    Years since quitting: 7.1   Smokeless tobacco: Never   Tobacco comments:    smoked about once a month   Vaping Use   Vaping Use: Never used  Substance and Sexual Activity   Alcohol use: Yes    Comment: 1 beer every 6 months   Drug use: No   Sexual activity: Yes    Partners: Female    Birth control/protection: None  Other Topics Concern   Not on file  Social History Narrative   Not on file   Social Determinants of Health   Financial Resource Strain: Low Risk  (02/25/2019)   Overall Financial Resource Strain (CARDIA)    Difficulty of Paying Living Expenses: Not hard at all   Food Insecurity: No Food Insecurity (02/25/2019)   Hunger Vital Sign    Worried About Running Out of Food in the Last Year: Never true    Ran Out of Food in the Last Year: Never true  Transportation Needs: No Transportation Needs (02/25/2019)   PRAPARE - Administrator, Civil Service (Medical): No    Lack of Transportation (Non-Medical): No  Physical Activity: Sufficiently Active (02/25/2019)   Exercise Vital Sign    Days of Exercise per Week: 4 days    Minutes of Exercise per Session: 50 min  Stress: Not on file  Social Connections: Not on file  Intimate Partner Violence: Not on file   Family Status  Relation Name Status   Mother  Alive   Father  Deceased   Sister  Alive   Brother  Alive   MGM  (Not Specified)   MGF  Alive   Sister  Alive   Neg Hx  (Not Specified)   Family History  Problem Relation Age of Onset   Healthy Mother    Cancer Father        unknown type   Healthy Sister    Healthy Brother    Liver cancer Maternal Grandmother    Pancreatic cancer Maternal Grandmother    Diabetes Maternal Grandfather    Healthy Sister    Colon cancer Neg  Hx    Prostate cancer Neg Hx    Allergies  Allergen Reactions   Shellfish Allergy Anaphylaxis    Patient Care Team: Erasmo Downer, MD as PCP - General (Family Medicine)   Medications: Outpatient Medications Prior to Visit  Medication Sig   vitamin B-12 (CYANOCOBALAMIN) 1000 MCG tablet Take 1 tablet (1,000 mcg total) by mouth daily.   No facility-administered medications prior to visit.    Review of Systems  Last CBC Lab Results  Component Value Date   WBC 3.7 11/29/2021   HGB 13.9 11/29/2021   HCT 40.9 11/29/2021   MCV 88 11/29/2021   MCH 29.8 11/29/2021   RDW 12.1 11/29/2021   PLT 244 11/29/2021   Last metabolic panel Lab Results  Component Value Date   GLUCOSE 80 05/08/2022   NA 142 05/08/2022   K 4.2 05/08/2022   CL 104 05/08/2022   CO2 26 05/08/2022   BUN 19 05/08/2022    CREATININE 1.18 05/08/2022   EGFR 83 05/08/2022   CALCIUM 8.9 05/08/2022   PROT 7.0 05/08/2022   ALBUMIN 4.5 05/08/2022   LABGLOB 2.5 05/08/2022   AGRATIO 1.8 05/08/2022   BILITOT 0.3 05/08/2022   ALKPHOS 61 05/08/2022   AST 14 05/08/2022   ALT 13 05/08/2022   ANIONGAP 6 11/19/2021   Last lipids Lab Results  Component Value Date   CHOL 174 05/08/2022   HDL 79 05/08/2022   LDLCALC 86 05/08/2022   TRIG 44 05/08/2022   CHOLHDL 2.5 03/09/2020   Last hemoglobin A1c Lab Results  Component Value Date   HGBA1C 5.4 05/08/2022   Last thyroid functions Lab Results  Component Value Date   TSH 0.712 03/22/2020   Last vitamin D No results found for: "25OHVITD2", "25OHVITD3", "VD25OH" Last vitamin B12 and Folate Lab Results  Component Value Date   VITAMINB12 187 02/07/2021   FOLATE 13.8 03/22/2020      Objective    There were no vitals taken for this visit. BP Readings from Last 3 Encounters:  05/08/22 104/62  11/29/21 103/64  11/19/21 110/78   Wt Readings from Last 3 Encounters:  05/08/22 183 lb 12.8 oz (83.4 kg)  11/29/21 182 lb 9.6 oz (82.8 kg)  11/19/21 178 lb (80.7 kg)       Physical Exam  ***  Last depression screening scores    05/08/2022    2:26 PM 03/17/2021    2:13 PM 03/09/2020   10:10 AM  PHQ 2/9 Scores  PHQ - 2 Score 0 0 0  PHQ- 9 Score  0 1   Last fall risk screening    05/08/2022    2:25 PM  Fall Risk   Falls in the past year? 0  Number falls in past yr: 0  Injury with Fall? 0   Last Audit-C alcohol use screening    05/08/2022    2:26 PM  Alcohol Use Disorder Test (AUDIT)  1. How often do you have a drink containing alcohol? 0  2. How many drinks containing alcohol do you have on a typical day when you are drinking? 0  3. How often do you have six or more drinks on one occasion? 0  AUDIT-C Score 0   A score of 3 or more in women, and 4 or more in men indicates increased risk for alcohol abuse, EXCEPT if all of the points are from  question 1   No results found for any visits on 05/10/23.  Assessment & Plan  Routine Health Maintenance and Physical Exam  Exercise Activities and Dietary recommendations  Goals   None     Immunization History  Administered Date(s) Administered   Td 09/26/2014   Tdap 09/26/2014    Health Maintenance  Topic Date Due   COVID-19 Vaccine (1) Never done   INFLUENZA VACCINE  07/26/2023   DTaP/Tdap/Td (3 - Td or Tdap) 09/26/2024   Hepatitis C Screening  Completed   HIV Screening  Completed   HPV VACCINES  Aged Out    Discussed health benefits of physical activity, and encouraged him to engage in regular exercise appropriate for his age and condition.  ***  No follow-ups on file.     {provider attestation***:1}   Shirlee Latch, MD  [facility] [phone] (phone) [phone] (fax)  [facility]

## 2023-05-10 ENCOUNTER — Ambulatory Visit (INDEPENDENT_AMBULATORY_CARE_PROVIDER_SITE_OTHER): Payer: BC Managed Care – PPO | Admitting: Family Medicine

## 2023-05-10 ENCOUNTER — Encounter: Payer: Self-pay | Admitting: Family Medicine

## 2023-05-10 VITALS — BP 106/65 | HR 73 | Temp 98.5°F | Resp 14 | Ht 71.0 in | Wt 183.3 lb

## 2023-05-10 DIAGNOSIS — Z833 Family history of diabetes mellitus: Secondary | ICD-10-CM | POA: Diagnosis not present

## 2023-05-10 DIAGNOSIS — Z0000 Encounter for general adult medical examination without abnormal findings: Secondary | ICD-10-CM

## 2023-05-10 DIAGNOSIS — D518 Other vitamin B12 deficiency anemias: Secondary | ICD-10-CM

## 2023-05-10 DIAGNOSIS — Z86010 Personal history of colonic polyps: Secondary | ICD-10-CM | POA: Diagnosis not present

## 2023-05-10 NOTE — Assessment & Plan Note (Signed)
Per patient report had colonoscopy >10 yrs ago with colon polyps and was recommend to repeat colonoscopy routinely Sent [facility] for records Will refer to GI for possible repeat colonoscopy - suspect they will want records as well

## 2023-05-11 ENCOUNTER — Telehealth: Payer: Self-pay

## 2023-05-11 DIAGNOSIS — N289 Disorder of kidney and ureter, unspecified: Secondary | ICD-10-CM

## 2023-05-11 LAB — CBC WITH DIFFERENTIAL/PLATELET
Basophils Absolute: 0 10*3/uL (ref 0.0–0.2)
Basos: 1 %
EOS (ABSOLUTE): 0.3 10*3/uL (ref 0.0–0.4)
Eos: 7 %
Hematocrit: 40 % (ref 37.5–51.0)
Hemoglobin: 13.1 g/dL (ref 13.0–17.7)
Immature Grans (Abs): 0 10*3/uL (ref 0.0–0.1)
Immature Granulocytes: 0 %
Lymphocytes Absolute: 1.8 10*3/uL (ref 0.7–3.1)
Lymphs: 42 %
MCH: 29.2 pg (ref 26.6–33.0)
MCHC: 32.8 g/dL (ref 31.5–35.7)
MCV: 89 fL (ref 79–97)
Monocytes Absolute: 0.4 10*3/uL (ref 0.1–0.9)
Monocytes: 9 %
Neutrophils Absolute: 1.7 10*3/uL (ref 1.4–7.0)
Neutrophils: 41 %
Platelets: 210 10*3/uL (ref 150–450)
RBC: 4.48 x10E6/uL (ref 4.14–5.80)
RDW: 12.4 % (ref 11.6–15.4)
WBC: 4.2 10*3/uL (ref 3.4–10.8)

## 2023-05-11 LAB — COMPREHENSIVE METABOLIC PANEL
ALT: 11 IU/L (ref 0–44)
AST: 18 IU/L (ref 0–40)
Albumin/Globulin Ratio: 2 (ref 1.2–2.2)
Albumin: 4.5 g/dL (ref 4.1–5.1)
Alkaline Phosphatase: 56 IU/L (ref 44–121)
BUN/Creatinine Ratio: 16 (ref 9–20)
BUN: 20 mg/dL (ref 6–20)
Bilirubin Total: 0.3 mg/dL (ref 0.0–1.2)
CO2: 27 mmol/L (ref 20–29)
Calcium: 9.5 mg/dL (ref 8.7–10.2)
Chloride: 101 mmol/L (ref 96–106)
Creatinine, Ser: 1.28 mg/dL — ABNORMAL HIGH (ref 0.76–1.27)
Globulin, Total: 2.3 g/dL (ref 1.5–4.5)
Glucose: 61 mg/dL — ABNORMAL LOW (ref 70–99)
Potassium: 4.7 mmol/L (ref 3.5–5.2)
Sodium: 141 mmol/L (ref 134–144)
Total Protein: 6.8 g/dL (ref 6.0–8.5)
eGFR: 74 mL/min/{1.73_m2} (ref >59)

## 2023-05-11 LAB — LIPID PANEL
Chol/HDL Ratio: 2.8 ratio (ref 0.0–5.0)
Cholesterol, Total: 199 mg/dL (ref 100–199)
HDL: 71 mg/dL (ref >39)
LDL Chol Calc (NIH): 116 mg/dL — ABNORMAL HIGH (ref 0–99)
Triglycerides: 68 mg/dL (ref 0–149)
VLDL Cholesterol Cal: 12 mg/dL (ref 5–40)

## 2023-05-11 LAB — VITAMIN B12: Vitamin B-12: 662 pg/mL (ref 232–1245)

## 2023-05-11 LAB — HEMOGLOBIN A1C
Est. average glucose Bld gHb Est-mCnc: 114 mg/dL
Hgb A1c MFr Bld: 5.6 % (ref 4.8–5.6)

## 2023-05-11 NOTE — Telephone Encounter (Signed)
-----   Message from Erasmo Downer, MD sent at 05/11/2023  8:02 AM EDT ----- Normal/stable labs, except for new worsening of kidney function.  Recommend cutting back on NSAIDs and supplements and hydrating well.  Recheck BMP in 1 month.

## 2023-05-14 ENCOUNTER — Telehealth: Payer: Self-pay

## 2023-05-14 NOTE — Telephone Encounter (Signed)
Gastroenterology Pre-Procedure Review  Colonoscopy referral received.  Colonoscopy triage completed.  Patients pathology report and triage reviewed by Dr. Tobi Bastos. Per Dr. Tobi Bastos Patient has been advised colonoscopy is not recommended at this time.  Pt does not have family history of colon cancer and pathology report notes specimens to be normal. A copy of patients pathology and procedure report will be mailed to him.     PATIENT REVIEW QUESTIONS: The patient responded to the following health history questions as indicated:    1. Are you having any GI issues? no 2. Do you have a personal history of Polyps? yes (1st colonoscopy in 68 age 37 a few sessile polyps were present. Noted to diminutive in size.  Colonoscopy performed at [facility].  No colonoscopy has been performed since this colonoscopy. Pathology report reviewed biopsies taken were all normal. ) 3. Do you have a family history of Colon Cancer or Polyps? no 4. Diabetes Mellitus? no 5. Joint replacements in the past 12 months?no 6. Major health problems in the past 3 months?no 7. Any artificial heart valves, MVP, or defibrillator?no    MEDICATIONS & ALLERGIES:    Patient reports the following regarding taking any anticoagulation/antiplatelet therapy:   Plavix, Coumadin, Eliquis, Xarelto, Lovenox, Pradaxa, Brilinta, or Effient? no Aspirin? no  Patient confirms/reports the following medications:  Current Outpatient Medications  Medication Sig Dispense Refill   vitamin B-12 (CYANOCOBALAMIN) 1000 MCG tablet Take 1 tablet (1,000 mcg total) by mouth daily. 30 tablet 5   No current facility-administered medications for this visit.    Patient confirms/reports the following allergies:  Allergies  Allergen Reactions   Shellfish Allergy Anaphylaxis    No orders of the defined types were placed in this encounter.   AUTHORIZATION INFORMATION Primary Insurance: 1D#: Group #:  Secondary Insurance: 1D#: Group  #:  SCHEDULE INFORMATION: Date: Not Needed Time: Location: Not Scheduled

## 2023-06-22 DIAGNOSIS — N289 Disorder of kidney and ureter, unspecified: Secondary | ICD-10-CM | POA: Diagnosis not present

## 2023-06-23 LAB — BASIC METABOLIC PANEL
BUN/Creatinine Ratio: 17 (ref 9–20)
BUN: 18 mg/dL (ref 6–20)
CO2: 24 mmol/L (ref 20–29)
Calcium: 9.6 mg/dL (ref 8.7–10.2)
Chloride: 103 mmol/L (ref 96–106)
Creatinine, Ser: 1.05 mg/dL (ref 0.76–1.27)
Glucose: 82 mg/dL (ref 70–99)
Potassium: 4.2 mmol/L (ref 3.5–5.2)
Sodium: 140 mmol/L (ref 134–144)
eGFR: 94 mL/min/{1.73_m2} (ref >59)

## 2024-05-12 ENCOUNTER — Encounter: Payer: Self-pay | Admitting: Family Medicine

## 2024-06-26 ENCOUNTER — Encounter: Admitting: Family Medicine

## 2024-07-29 ENCOUNTER — Encounter: Payer: Self-pay | Admitting: Family Medicine

## 2024-07-29 ENCOUNTER — Ambulatory Visit: Admitting: Family Medicine

## 2024-07-29 VITALS — BP 102/68 | HR 64 | Temp 97.9°F | Ht 71.0 in | Wt 193.4 lb

## 2024-07-29 DIAGNOSIS — Z833 Family history of diabetes mellitus: Secondary | ICD-10-CM | POA: Diagnosis not present

## 2024-07-29 DIAGNOSIS — Z0000 Encounter for general adult medical examination without abnormal findings: Secondary | ICD-10-CM | POA: Diagnosis not present

## 2024-07-29 DIAGNOSIS — Z23 Encounter for immunization: Secondary | ICD-10-CM

## 2024-07-29 DIAGNOSIS — Z789 Other specified health status: Secondary | ICD-10-CM | POA: Diagnosis not present

## 2024-07-29 NOTE — Progress Notes (Signed)
 Complete physical exam   Patient: Sean Butler   DOB: November 09, 1986   38 y.o. Male  MRN: [id number] Visit Date: 07/29/2024  Today's healthcare provider: Jon Eva, MD   Chief Complaint  Patient presents with   Annual Exam    Diet: Normal Exercise: 3x week 45-1 hr Sleep: well Overall feeling: Well     Subjective    Sean Butler is a 38 y.o. male who presents today for a complete physical exam.   Discussed the use of AI scribe software for clinical note transcription with the patient, who gave verbal consent to proceed.  History of Present Illness   Sean Butler is a 38 year old male who presents for an annual physical exam. He is accompanied by his daughters.  He has no specific health concerns at this time and feels well. He is aware of the upcoming flu shot and COVID vaccine availability but is not inclined to receive the flu shot. He is uncertain about his hepatitis B vaccination status and plans to verify with his mother. There is a family history of diabetes, and he typically has an A1c checked during his annual labs. He is currently on 'dad duty' for the summer, spending time with his children during their school break. No abdominal pain is present.        Last depression screening scores    07/29/2024    9:38 AM 05/10/2023    2:47 PM 05/08/2022    2:26 PM  PHQ 2/9 Scores  PHQ - 2 Score 0 0 0  PHQ- 9 Score 3 0    Last fall risk screening    07/29/2024    9:38 AM  Fall Risk   Falls in the past year? 0  Number falls in past yr: 0  Injury with Fall? 0  Risk for fall due to : No Fall Risks  Follow up Falls evaluation completed        Medications: Outpatient Medications Prior to Visit  Medication Sig   vitamin B-12 (CYANOCOBALAMIN ) 1000 MCG tablet Take 1 tablet (1,000 mcg total) by mouth daily.   No facility-administered medications prior to visit.    Review of Systems    Objective    BP 102/68 (BP Location: Left Arm, Patient Position: Sitting,  Cuff Size: Normal)   Pulse 64   Temp 97.9 F (36.6 C) (Oral)   Ht 5' 11 (1.803 m)   Wt 193 lb 6.4 oz (87.7 kg)   SpO2 100%   BMI 26.97 kg/m    Physical Exam Vitals reviewed.  Constitutional:      General: He is not in acute distress.    Appearance: Normal appearance. He is well-developed. He is not diaphoretic.  HENT:     Head: Normocephalic and atraumatic.     Right Ear: Tympanic membrane, ear canal and external ear normal.     Left Ear: Tympanic membrane, ear canal and external ear normal.     Nose: Nose normal.     Mouth/Throat:     Mouth: Mucous membranes are moist.     Pharynx: Oropharynx is clear. No oropharyngeal exudate.  Eyes:     General: No scleral icterus.    Conjunctiva/sclera: Conjunctivae normal.     Pupils: Pupils are equal, round, and reactive to light.  Neck:     Thyroid : No thyromegaly.  Cardiovascular:     Rate and Rhythm: Normal rate and regular rhythm.     Heart sounds: Normal heart sounds. No murmur  heard. Pulmonary:     Effort: Pulmonary effort is normal. No respiratory distress.     Breath sounds: Normal breath sounds. No wheezing or rales.  Abdominal:     General: There is no distension.     Palpations: Abdomen is soft.     Tenderness: There is no abdominal tenderness.  Musculoskeletal:        General: No deformity.     Cervical back: Neck supple.     Right lower leg: No edema.     Left lower leg: No edema.  Lymphadenopathy:     Cervical: No cervical adenopathy.  Skin:    General: Skin is warm and dry.     Findings: No rash.  Neurological:     Mental Status: He is alert and oriented to person, place, and time. Mental status is at baseline.     Gait: Gait normal.  Psychiatric:        Mood and Affect: Mood normal.        Behavior: Behavior normal.        Thought Content: Thought content normal.      No results found for any visits on 07/29/24.  Assessment & Plan    Routine Health Maintenance and Physical Exam  Exercise  Activities and Dietary recommendations  Goals   None     Immunization History  Administered Date(s) Administered   Td 09/26/2014   Tdap 09/26/2014    Health Maintenance  Topic Date Due   Hepatitis B Vaccines (1 of 3 - 19+ 3-dose series) Never done   HPV VACCINES (1 - 3-dose SCDM series) Never done   COVID-19 Vaccine (1 - 2024-25 season) Never done   INFLUENZA VACCINE  03/24/2025 (Originally 07/25/2024)   DTaP/Tdap/Td (3 - Td or Tdap) 09/26/2024   Hepatitis C Screening  Completed   HIV Screening  Completed   Meningococcal B Vaccine  Aged Out    Discussed health benefits of physical activity, and encouraged him to engage in regular exercise appropriate for his age and condition.  Problem List Items Addressed This Visit       Other   Family history of diabetes mellitus   Relevant Orders   HgB A1c   Other Visit Diagnoses       Encounter for annual physical exam    -  Primary   Relevant Orders   Comprehensive Metabolic Panel (CMET)   Lipid Panel With LDL/HDL Ratio   HgB A1c   Hepatitis B Surface AntiBODY   CBC w/Diff/Platelet     Hepatitis B vaccination status unknown       Relevant Orders   Hepatitis B Surface AntiBODY     Immunization due               Adult Wellness Visit Routine adult wellness visit with no acute concerns or current health issues identified. Discussed wellness, screening, and vaccination status. - Administer tetanus vaccine today, noting potential for arm soreness but generally well tolerated. - Order blood work to assess kidney and liver function, cholesterol, complete blood count, blood glucose, and A1c due to family history of diabetes. - Check hepatitis B titer to determine immunity status. - Discuss potential hepatitis B vaccination if not immune, highlighting risk of cirrhosis and liver cancer from hepatitis B infection. - Schedule next annual physical in one year.       Return in about 1 year (around 07/29/2025) for CPE.     Jon Eva, MD  [facility]  [phone] (phone) [phone] (fax)  [facility]

## 2024-07-29 NOTE — Addendum Note (Signed)
 Addended by: CHERRY CHIQUITA HERO on: 07/29/2024 10:33 AM   Modules accepted: Orders

## 2024-07-30 LAB — CBC WITH DIFFERENTIAL/PLATELET
Basophils Absolute: 0 x10E3/uL (ref 0.0–0.2)
Basos: 1 %
EOS (ABSOLUTE): 0.2 x10E3/uL (ref 0.0–0.4)
Eos: 5 %
Hematocrit: 42.7 % (ref 37.5–51.0)
Hemoglobin: 13.5 g/dL (ref 13.0–17.7)
Immature Grans (Abs): 0 x10E3/uL (ref 0.0–0.1)
Immature Granulocytes: 0 %
Lymphocytes Absolute: 1.4 x10E3/uL (ref 0.7–3.1)
Lymphs: 45 %
MCH: 29.3 pg (ref 26.6–33.0)
MCHC: 31.6 g/dL (ref 31.5–35.7)
MCV: 93 fL (ref 79–97)
Monocytes Absolute: 0.4 x10E3/uL (ref 0.1–0.9)
Monocytes: 12 %
Neutrophils Absolute: 1.1 x10E3/uL — ABNORMAL LOW (ref 1.4–7.0)
Neutrophils: 37 %
Platelets: 208 x10E3/uL (ref 150–450)
RBC: 4.6 x10E6/uL (ref 4.14–5.80)
RDW: 12.2 % (ref 11.6–15.4)
WBC: 3.1 x10E3/uL — ABNORMAL LOW (ref 3.4–10.8)

## 2024-07-30 LAB — COMPREHENSIVE METABOLIC PANEL WITH GFR
ALT: 12 IU/L (ref 0–44)
AST: 16 IU/L (ref 0–40)
Albumin: 4.4 g/dL (ref 4.1–5.1)
Alkaline Phosphatase: 60 IU/L (ref 44–121)
BUN/Creatinine Ratio: 19 (ref 9–20)
BUN: 23 mg/dL — ABNORMAL HIGH (ref 6–20)
Bilirubin Total: 0.3 mg/dL (ref 0.0–1.2)
CO2: 23 mmol/L (ref 20–29)
Calcium: 9.1 mg/dL (ref 8.7–10.2)
Chloride: 104 mmol/L (ref 96–106)
Creatinine, Ser: 1.18 mg/dL (ref 0.76–1.27)
Globulin, Total: 2.4 g/dL (ref 1.5–4.5)
Glucose: 82 mg/dL (ref 70–99)
Potassium: 4.8 mmol/L (ref 3.5–5.2)
Sodium: 141 mmol/L (ref 134–144)
Total Protein: 6.8 g/dL (ref 6.0–8.5)
eGFR: 82 mL/min/1.73 (ref >59)

## 2024-07-30 LAB — LIPID PANEL WITH LDL/HDL RATIO
Cholesterol, Total: 203 mg/dL — ABNORMAL HIGH (ref 100–199)
HDL: 76 mg/dL (ref >39)
LDL Chol Calc (NIH): 120 mg/dL — ABNORMAL HIGH (ref 0–99)
LDL/HDL Ratio: 1.6 ratio (ref 0.0–3.6)
Triglycerides: 35 mg/dL (ref 0–149)
VLDL Cholesterol Cal: 7 mg/dL (ref 5–40)

## 2024-07-30 LAB — HEMOGLOBIN A1C
Est. average glucose Bld gHb Est-mCnc: 108 mg/dL
Hgb A1c MFr Bld: 5.4 % (ref 4.8–5.6)

## 2024-07-30 LAB — HEPATITIS B SURFACE ANTIBODY,QUALITATIVE: Hep B Surface Ab, Qual: REACTIVE

## 2024-07-31 ENCOUNTER — Ambulatory Visit: Payer: Self-pay | Admitting: Family Medicine

## 2025-07-30 ENCOUNTER — Encounter: Admitting: Family Medicine
